# Patient Record
Sex: Female | Born: 1950 | Race: White | Hispanic: No | Smoking: Never smoker
Health system: Southern US, Community
[De-identification: ages and names within clinical notes are randomized; demographics above are authoritative.]

## PROBLEM LIST (undated history)

## (undated) ENCOUNTER — Emergency Department (HOSPITAL_COMMUNITY): Discharge status: Absent without leave | Payer: BC Managed Care – PPO

## (undated) DIAGNOSIS — M1990 Unspecified osteoarthritis, unspecified site: Secondary | ICD-10-CM

## (undated) HISTORY — PX: APPENDECTOMY: SHX54

## (undated) HISTORY — PX: HERNIA REPAIR: SHX51

---

## 1999-02-18 ENCOUNTER — Other Ambulatory Visit: Admission: RE | Admit: 1999-02-18 | Discharge: 1999-02-18 | Payer: Self-pay | Admitting: Obstetrics & Gynecology

## 2000-05-29 ENCOUNTER — Other Ambulatory Visit: Admission: RE | Admit: 2000-05-29 | Discharge: 2000-05-29 | Payer: Self-pay | Admitting: Obstetrics & Gynecology

## 2000-05-29 ENCOUNTER — Encounter (INDEPENDENT_AMBULATORY_CARE_PROVIDER_SITE_OTHER): Payer: Self-pay

## 2000-05-29 ENCOUNTER — Other Ambulatory Visit: Admission: RE | Admit: 2000-05-29 | Discharge: 2000-05-29 | Payer: Self-pay | Admitting: Family Medicine

## 2000-10-30 ENCOUNTER — Other Ambulatory Visit: Admission: RE | Admit: 2000-10-30 | Discharge: 2000-10-30 | Payer: Self-pay | Admitting: Obstetrics & Gynecology

## 2001-11-23 ENCOUNTER — Other Ambulatory Visit: Admission: RE | Admit: 2001-11-23 | Discharge: 2001-11-23 | Payer: Self-pay | Admitting: Obstetrics & Gynecology

## 2002-06-05 ENCOUNTER — Ambulatory Visit (HOSPITAL_COMMUNITY): Admission: RE | Admit: 2002-06-05 | Discharge: 2002-06-05 | Payer: Self-pay | Admitting: Gastroenterology

## 2002-06-05 ENCOUNTER — Encounter: Payer: Self-pay | Admitting: Gastroenterology

## 2002-06-05 ENCOUNTER — Encounter (INDEPENDENT_AMBULATORY_CARE_PROVIDER_SITE_OTHER): Payer: Self-pay | Admitting: Specialist

## 2002-06-14 ENCOUNTER — Other Ambulatory Visit: Admission: RE | Admit: 2002-06-14 | Discharge: 2002-06-14 | Payer: Self-pay | Admitting: Obstetrics & Gynecology

## 2003-03-03 ENCOUNTER — Other Ambulatory Visit: Admission: RE | Admit: 2003-03-03 | Discharge: 2003-03-03 | Payer: Self-pay | Admitting: Obstetrics & Gynecology

## 2003-07-10 ENCOUNTER — Inpatient Hospital Stay (HOSPITAL_COMMUNITY): Admission: AD | Admit: 2003-07-10 | Discharge: 2003-07-15 | Payer: Self-pay | Admitting: Psychiatry

## 2003-07-10 ENCOUNTER — Emergency Department (HOSPITAL_COMMUNITY): Admission: EM | Admit: 2003-07-10 | Discharge: 2003-07-10 | Payer: Self-pay | Admitting: Emergency Medicine

## 2003-09-05 ENCOUNTER — Other Ambulatory Visit: Admission: RE | Admit: 2003-09-05 | Discharge: 2003-09-05 | Payer: Self-pay | Admitting: Obstetrics & Gynecology

## 2004-06-14 ENCOUNTER — Encounter: Admission: RE | Admit: 2004-06-14 | Discharge: 2004-06-14 | Payer: Self-pay | Admitting: Family Medicine

## 2004-08-29 ENCOUNTER — Emergency Department (HOSPITAL_COMMUNITY): Admission: EM | Admit: 2004-08-29 | Discharge: 2004-08-29 | Payer: Self-pay | Admitting: Family Medicine

## 2005-03-30 ENCOUNTER — Other Ambulatory Visit: Admission: RE | Admit: 2005-03-30 | Discharge: 2005-03-30 | Payer: Self-pay | Admitting: Family Medicine

## 2005-10-29 ENCOUNTER — Emergency Department (HOSPITAL_COMMUNITY): Admission: EM | Admit: 2005-10-29 | Discharge: 2005-10-29 | Payer: Self-pay | Admitting: Emergency Medicine

## 2006-01-05 ENCOUNTER — Emergency Department (HOSPITAL_COMMUNITY): Admission: EM | Admit: 2006-01-05 | Discharge: 2006-01-05 | Payer: Self-pay | Admitting: Family Medicine

## 2006-07-06 ENCOUNTER — Other Ambulatory Visit: Admission: RE | Admit: 2006-07-06 | Discharge: 2006-07-06 | Payer: Self-pay | Admitting: Family Medicine

## 2006-07-28 ENCOUNTER — Ambulatory Visit (HOSPITAL_COMMUNITY): Admission: RE | Admit: 2006-07-28 | Discharge: 2006-07-28 | Payer: Self-pay | Admitting: Family Medicine

## 2006-08-31 ENCOUNTER — Emergency Department (HOSPITAL_COMMUNITY): Admission: EM | Admit: 2006-08-31 | Discharge: 2006-08-31 | Payer: Self-pay | Admitting: Emergency Medicine

## 2006-09-05 ENCOUNTER — Encounter: Admission: RE | Admit: 2006-09-05 | Discharge: 2006-09-05 | Payer: Self-pay | Admitting: Family Medicine

## 2006-11-15 ENCOUNTER — Emergency Department (HOSPITAL_COMMUNITY): Admission: EM | Admit: 2006-11-15 | Discharge: 2006-11-15 | Payer: Self-pay | Admitting: Family Medicine

## 2007-07-30 ENCOUNTER — Encounter: Admission: RE | Admit: 2007-07-30 | Discharge: 2007-07-30 | Payer: Self-pay | Admitting: Family Medicine

## 2008-04-03 ENCOUNTER — Encounter: Admission: RE | Admit: 2008-04-03 | Discharge: 2008-04-03 | Payer: Self-pay | Admitting: Infectious Diseases

## 2008-04-25 IMAGING — CR DG CHEST 2V
2 series · 2 of 2 positions shown · non-contrast
Comparison: 08/31/2006.

CHEST - 2 VIEW
CLINICAL DATA: Back and chest pain.

[view not recorded (1 of 2)]
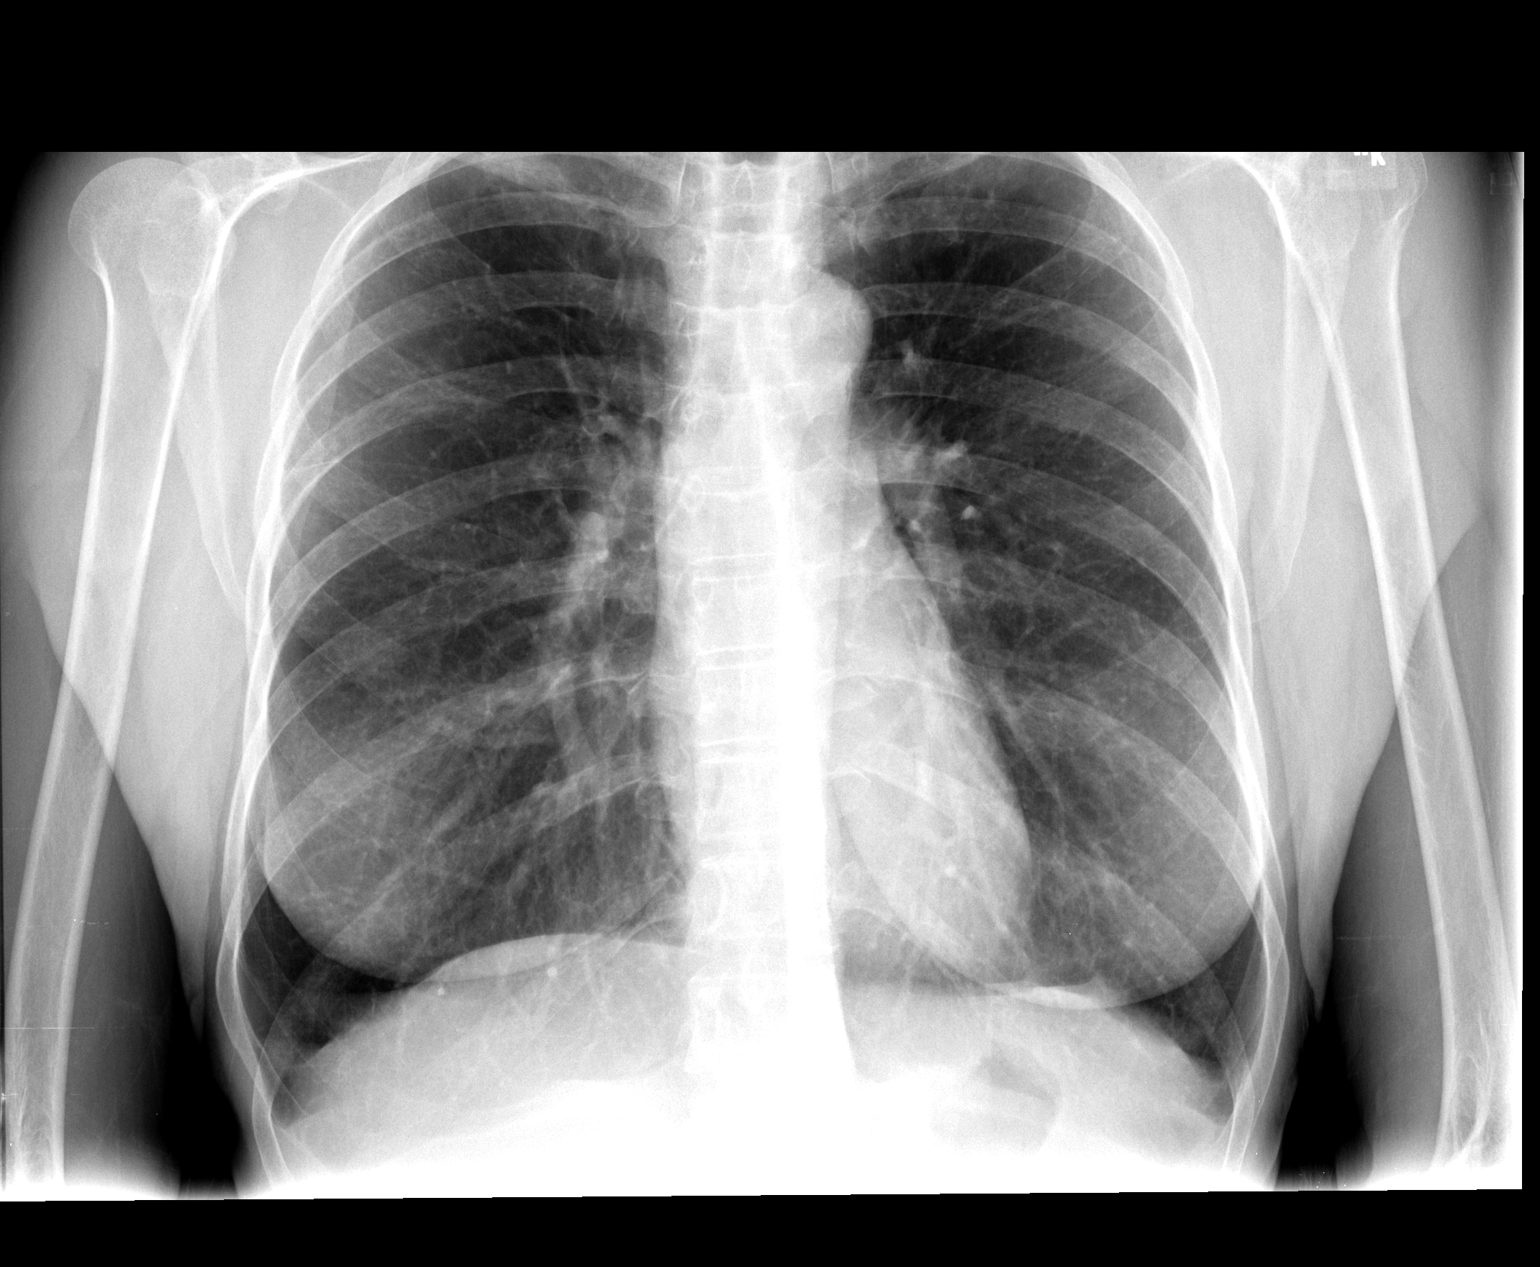

[view not recorded (2 of 2)]
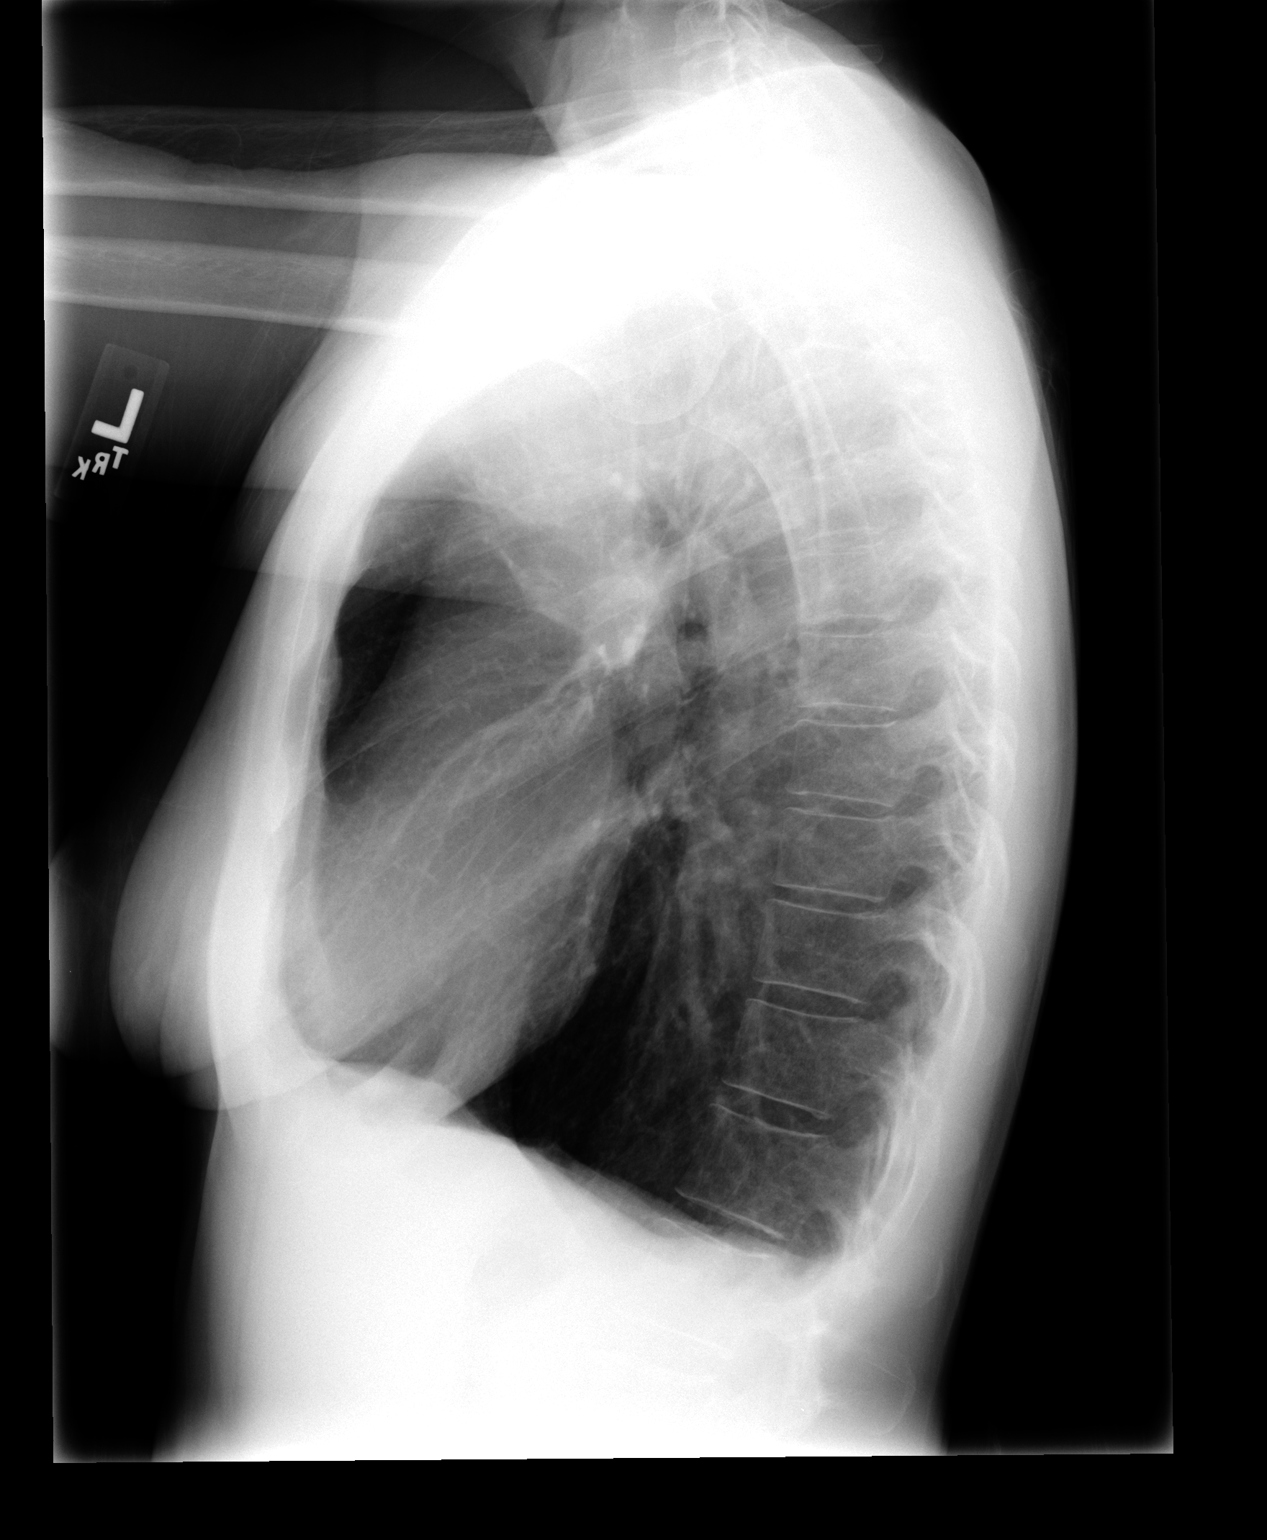

[2 of 2 positions shown; findings below may reference images not displayed]

FINDINGS: Lungs clear. Cardiomediastinal contours normal. No edema, effusions,
or airspace disease. Hyperinflation consistent with COPD/emphysema.
**********************************************************************
IMPRESSION
Emphysematous change without acute cardiopulmonary disease. 
**********************************************************************

## 2008-08-16 ENCOUNTER — Emergency Department (HOSPITAL_COMMUNITY): Admission: EM | Admit: 2008-08-16 | Discharge: 2008-08-16 | Payer: Self-pay | Admitting: Family Medicine

## 2008-09-07 ENCOUNTER — Emergency Department (HOSPITAL_COMMUNITY): Admission: EM | Admit: 2008-09-07 | Discharge: 2008-09-07 | Payer: Self-pay | Admitting: Emergency Medicine

## 2009-01-11 ENCOUNTER — Emergency Department (HOSPITAL_COMMUNITY): Admission: EM | Admit: 2009-01-11 | Discharge: 2009-01-11 | Payer: Self-pay | Admitting: Emergency Medicine

## 2010-04-25 ENCOUNTER — Encounter: Payer: Self-pay | Admitting: Family Medicine

## 2010-07-08 LAB — CBC
HCT: 43.2 % (ref 36.0–46.0)
Hemoglobin: 14.8 g/dL (ref 12.0–15.0)
MCHC: 34.2 g/dL (ref 30.0–36.0)
MCV: 93.9 fL (ref 78.0–100.0)
Platelets: 252 10*3/uL (ref 150–400)
RBC: 4.6 MIL/uL (ref 3.87–5.11)
RDW: 12 % (ref 11.5–15.5)
WBC: 10.4 10*3/uL (ref 4.0–10.5)

## 2010-07-08 LAB — DIFFERENTIAL
Basophils Absolute: 0 10*3/uL (ref 0.0–0.1)
Basophils Relative: 0 % (ref 0–1)
Eosinophils Absolute: 0 10*3/uL (ref 0.0–0.7)
Eosinophils Relative: 0 % (ref 0–5)
Lymphocytes Relative: 21 % (ref 12–46)
Lymphs Abs: 2.1 10*3/uL (ref 0.7–4.0)
Monocytes Absolute: 0.7 10*3/uL (ref 0.1–1.0)
Monocytes Relative: 7 % (ref 3–12)
Neutro Abs: 7.5 10*3/uL (ref 1.7–7.7)
Neutrophils Relative %: 72 % (ref 43–77)

## 2010-07-08 LAB — SEDIMENTATION RATE: Sed Rate: 5 mm/hr (ref 0–22)

## 2010-07-08 LAB — RPR: RPR Ser Ql: NONREACTIVE

## 2010-08-20 NOTE — Discharge Summary (Signed)
NAME:  Alyssa Vaughn, Alyssa Vaughn                       ACCOUNT NO.:  [id number]   MEDICAL RECORD NO.:  [id number]                   PATIENT TYPE:  IPS   LOCATION:  0502                                 FACILITY:  [facility]   PHYSICIAN:  Geoffery Lyons, M.D.                   DATE OF BIRTH:  14-Feb-1951   DATE OF ADMISSION:  07/10/2003  DATE OF DISCHARGE:  07/15/2003                                 DISCHARGE SUMMARY   CHIEF COMPLAINT AND PRESENTING ILLNESS:  This was the first admission to  [facility]  for this 60 year old white female,  intoxicated, threatening suicide.  Had resumed alcohol after starting to  feel depressed.  Went to a hotel with a man, called her husband numerous  times.  He had her committed.  Worried that she was using crack,  methamphetamine, etc. But she only relapsed on alcohol.   PAST PSYCHIATRIC HISTORY:  Dr. Jennelle Human prescribed Prozac, Dextrostat 10 mg  twice a day for ADHD and Vistaril for sleep.   ALCOHOL AND DRUG HISTORY:  [facility] 2003, recent relapse on alcohol.   PAST MEDICAL HISTORY:  Noncontributory.   MEDICATIONS:  Prozac 15 mg per day, Dextrostat 10 mg twice a day, Vistaril.   PHYSICAL EXAMINATION:  Performed, failed to show any acute findings.   LABORATORY WORKUP:  CBC was within normal limits.  Thyroid profile within  normal limits.  Blood chemistries were within normal limits.  EKG normal.   MENTAL STATUS EXAM:  Cognition well preserved.   ADMISSION DIAGNOSES:   AXIS I:   AXIS II:  No diagnosis.   AXIS III:   AXIS IV:  Moderate.   AXIS V:  Global assessment of function upon admission @@, highest global  assessment of function in past year @@   COURSE IN HOSPITAL:  She was admitted and started on intensive individual  and group psychotherapy.  She was given Ambien for sleep as well as  Seroquel.  She was detoxified with Librium.  She was started on Lexapro 5 mg  per day and Lexapro was increased to 10.  She was having a very  hard time  detoxing, endorsing feeling very overwhelmed, distraught, relapsed after a  couple of years of abstinence, got involved trying to help a homeless man  who was a substance abuser.  Series of events led her to go into full blown,  active addiction.  Had not been back to work in a week.  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  Very overwhelmed and ashamed, guilty.  Initially felt that the husband was supportive but later communication  stated that the husband would not have her back.  Not knowing what to do in  terms of the school, very anxious, depressed, tearful, almost unable to stop  crying, dealing with shame and guilt, hopelessness and helplessness.  She  did endorse that this was a relationship that she  had with this homeless guy  and became almost obsessed in trying to help him.  We continued to detox.  She had that the husband was not willing to take her back.  She started  looking into the possibility of a halfway house.  Continued to be very  labile, very tearful.  As she was able to talk in group and deal with  individually, she was more insightful.  There was a place available in the  [facility] and she was willing to go.  So on April 12, she was in full  contact with reality.  There were no suicidal ideas, no homicidal ideas.  She was going to the halfway house, insightful, willing to work on long term  abstinence and all her addictive patterns, more aware of what she needed to  do, willing and motivated to pursue it.   DISCHARGE DIAGNOSES:   AXIS I:  1. Alcohol dependence.  2. Depressive disorder not otherwise specified..  3. Attention deficit hyperactivity disorder.   AXIS II:  No diagnosis.   AXIS III:  No diagnosis.   AXIS IV:  Moderate.   AXIS V:  Global assessment of function upon discharge 55.   DISCHARGE MEDICATIONS:  1. Seroquel 100 1/2 twice a day and 1 at night.  2. Lexapro 10 mg daily.   DISPOSITION:  Follow up with Dr. Jennelle Human.                                                Geoffery Lyons, M.D.    IL/MEDQ  D:  08/10/2003  T:  08/11/2003  Job:  [id number]

## 2011-01-17 LAB — BASIC METABOLIC PANEL
Calcium: 8.6
GFR calc non Af Amer: >60
Glucose, Bld: 81
Potassium: 3.3 — ABNORMAL LOW
Sodium: 142

## 2011-01-17 LAB — RAPID URINE DRUG SCREEN, HOSP PERFORMED
Benzodiazepines: NOT DETECTED
Cocaine: NOT DETECTED
Opiates: NOT DETECTED
Tetrahydrocannabinol: NOT DETECTED

## 2011-01-17 LAB — POCT CARDIAC MARKERS
CKMB, poc: <1 — ABNORMAL LOW
Myoglobin, poc: 34.5
Operator id: 285491
Troponin i, poc: <0.05

## 2011-01-17 LAB — POCT URINALYSIS DIP (DEVICE)
Bilirubin Urine: NEGATIVE
Glucose, UA: NEGATIVE
Hgb urine dipstick: NEGATIVE
Ketones, ur: NEGATIVE
Operator id: 247071
Specific Gravity, Urine: 1.01
Urobilinogen, UA: 0.2

## 2011-01-17 LAB — DIFFERENTIAL
Basophils Absolute: 0
Basophils Relative: 0
Lymphocytes Relative: 15
Monocytes Relative: 6
Neutro Abs: 8.9 — ABNORMAL HIGH
Neutrophils Relative %: 77

## 2011-01-17 LAB — CBC
Hemoglobin: 14.4
MCHC: 34.6
RBC: 4.59

## 2014-07-15 ENCOUNTER — Other Ambulatory Visit (HOSPITAL_COMMUNITY): Payer: Self-pay | Admitting: Gastroenterology

## 2014-07-15 DIAGNOSIS — B1920 Unspecified viral hepatitis C without hepatic coma: Secondary | ICD-10-CM

## 2014-08-06 ENCOUNTER — Ambulatory Visit (HOSPITAL_COMMUNITY): Payer: Self-pay

## 2014-08-14 ENCOUNTER — Ambulatory Visit (HOSPITAL_COMMUNITY): Payer: Self-pay

## 2014-09-16 ENCOUNTER — Ambulatory Visit (HOSPITAL_COMMUNITY): Payer: Self-pay

## 2014-09-23 ENCOUNTER — Ambulatory Visit (HOSPITAL_COMMUNITY)
Admission: RE | Admit: 2014-09-23 | Discharge: 2014-09-23 | Disposition: A | Discharge status: Home / Self Care | Payer: BC Managed Care – PPO | Source: Ambulatory Visit | Attending: Gastroenterology | Admitting: Gastroenterology

## 2014-09-23 DIAGNOSIS — K8020 Calculus of gallbladder without cholecystitis without obstruction: Secondary | ICD-10-CM | POA: Insufficient documentation

## 2014-09-23 DIAGNOSIS — B182 Chronic viral hepatitis C: Secondary | ICD-10-CM | POA: Diagnosis present

## 2014-09-23 DIAGNOSIS — B1920 Unspecified viral hepatitis C without hepatic coma: Secondary | ICD-10-CM

## 2014-12-22 ENCOUNTER — Other Ambulatory Visit: Payer: Self-pay | Admitting: Obstetrics & Gynecology

## 2014-12-24 LAB — CYTOLOGY - PAP

## 2015-10-22 ENCOUNTER — Other Ambulatory Visit (HOSPITAL_COMMUNITY): Payer: Self-pay | Admitting: Neurosurgery

## 2015-10-22 DIAGNOSIS — R1310 Dysphagia, unspecified: Secondary | ICD-10-CM

## 2015-10-27 ENCOUNTER — Ambulatory Visit (HOSPITAL_COMMUNITY)
Admission: RE | Admit: 2015-10-27 | Discharge: 2015-10-27 | Disposition: A | Discharge status: Home / Self Care | Payer: BC Managed Care – PPO | Source: Ambulatory Visit | Attending: Neurosurgery | Admitting: Neurosurgery

## 2015-10-27 DIAGNOSIS — R1312 Dysphagia, oropharyngeal phase: Secondary | ICD-10-CM | POA: Insufficient documentation

## 2015-10-27 DIAGNOSIS — R1310 Dysphagia, unspecified: Secondary | ICD-10-CM

## 2015-10-27 NOTE — Progress Notes (Signed)
MBSS complete. Full report located under chart review in imaging section.   CH[facility] IP CLINICAL IMPRESSIONS 10/27/2015  Therapy Diagnosis Suspected primary esophageal dysphagia  Clinical Impression Pt exhibits a suspected primary esophageal dysphagia. MBS does not diagnose below the level of the upper esophageal sphincter, however esophagus was scanned revealing what appeared to be mid to distal esophageal stasis. Findings coincide with pt's symptoms of frequent throat clearing, pharyngeal globus sensation, hoarse vocal quality and recent eructation. Slight residue at level of cervical esophagus. No larygneal penetration or aspiration; no pharyngeal residue. SLP educated pt on esophageal precautions. Recommend continue regular texture, thin, liquids, esophageal precautions and discussion with MD re: possible further esophageal work up, medications if warranted.   Impact on safety and function Mild aspiration risk    Houston Siren M.Ed [employer] [phone]

## 2016-01-11 ENCOUNTER — Other Ambulatory Visit: Payer: Self-pay | Admitting: Neurosurgery

## 2016-01-11 DIAGNOSIS — M542 Cervicalgia: Secondary | ICD-10-CM

## 2016-01-21 ENCOUNTER — Other Ambulatory Visit: Payer: BC Managed Care – PPO

## 2016-03-15 ENCOUNTER — Other Ambulatory Visit: Payer: Self-pay | Admitting: Neurosurgery

## 2016-03-15 DIAGNOSIS — M542 Cervicalgia: Secondary | ICD-10-CM

## 2016-03-21 ENCOUNTER — Ambulatory Visit
Admission: RE | Admit: 2016-03-21 | Discharge: 2016-03-21 | Disposition: A | Discharge status: Home / Self Care | Payer: BC Managed Care – PPO | Source: Ambulatory Visit | Attending: Neurosurgery | Admitting: Neurosurgery

## 2016-03-21 DIAGNOSIS — M542 Cervicalgia: Secondary | ICD-10-CM

## 2016-04-05 ENCOUNTER — Ambulatory Visit
Admission: RE | Admit: 2016-04-05 | Discharge: 2016-04-05 | Disposition: A | Discharge status: Home / Self Care | Payer: BC Managed Care – PPO | Source: Ambulatory Visit | Attending: Chiropractic Medicine | Admitting: Chiropractic Medicine

## 2016-04-05 ENCOUNTER — Other Ambulatory Visit: Payer: Self-pay | Admitting: Chiropractic Medicine

## 2016-04-05 DIAGNOSIS — M25551 Pain in right hip: Secondary | ICD-10-CM

## 2016-06-09 ENCOUNTER — Other Ambulatory Visit: Payer: Self-pay | Admitting: Otolaryngology

## 2016-06-09 ENCOUNTER — Ambulatory Visit
Admission: RE | Admit: 2016-06-09 | Discharge: 2016-06-09 | Disposition: A | Discharge status: Home / Self Care | Payer: BC Managed Care – PPO | Source: Ambulatory Visit | Attending: Otolaryngology | Admitting: Otolaryngology

## 2016-06-09 DIAGNOSIS — J320 Chronic maxillary sinusitis: Secondary | ICD-10-CM

## 2016-07-06 ENCOUNTER — Other Ambulatory Visit (HOSPITAL_COMMUNITY): Payer: Self-pay | Admitting: Otolaryngology

## 2016-07-06 DIAGNOSIS — J320 Chronic maxillary sinusitis: Secondary | ICD-10-CM

## 2016-07-19 ENCOUNTER — Ambulatory Visit (HOSPITAL_COMMUNITY): Payer: BC Managed Care – PPO

## 2017-09-05 ENCOUNTER — Other Ambulatory Visit: Payer: Self-pay

## 2017-09-05 ENCOUNTER — Emergency Department (HOSPITAL_COMMUNITY)
Admission: EM | Admit: 2017-09-05 | Discharge: 2017-09-05 | Discharge status: Against Medical Advice | Payer: BC Managed Care – PPO | Attending: Emergency Medicine | Admitting: Emergency Medicine

## 2017-09-05 ENCOUNTER — Encounter (HOSPITAL_COMMUNITY): Payer: Self-pay | Admitting: Emergency Medicine

## 2017-09-05 ENCOUNTER — Emergency Department (HOSPITAL_COMMUNITY): Payer: BC Managed Care – PPO

## 2017-09-05 DIAGNOSIS — Z79899 Other long term (current) drug therapy: Secondary | ICD-10-CM | POA: Insufficient documentation

## 2017-09-05 DIAGNOSIS — R079 Chest pain, unspecified: Secondary | ICD-10-CM

## 2017-09-05 DIAGNOSIS — R001 Bradycardia, unspecified: Secondary | ICD-10-CM | POA: Diagnosis not present

## 2017-09-05 HISTORY — DX: Unspecified osteoarthritis, unspecified site: M19.90

## 2017-09-05 LAB — BASIC METABOLIC PANEL
Anion gap: 8 (ref 5–15)
BUN: 14 mg/dL (ref 6–20)
CO2: 29 mmol/L (ref 22–32)
CREATININE: 0.69 mg/dL (ref 0.44–1.00)
Calcium: 9.9 mg/dL (ref 8.9–10.3)
Chloride: 106 mmol/L (ref 101–111)
GFR calc Af Amer: >60 mL/min (ref >60)
GFR calc non Af Amer: >60 mL/min (ref >60)
GLUCOSE: 99 mg/dL (ref 65–99)
Potassium: 3.7 mmol/L (ref 3.5–5.1)
Sodium: 143 mmol/L (ref 135–145)

## 2017-09-05 LAB — CBC
HCT: 42.6 % (ref 36.0–46.0)
Hemoglobin: 14.5 g/dL (ref 12.0–15.0)
MCH: 30.8 pg (ref 26.0–34.0)
MCHC: 34 g/dL (ref 30.0–36.0)
MCV: 90.4 fL (ref 78.0–100.0)
PLATELETS: 206 10*3/uL (ref 150–400)
RBC: 4.71 MIL/uL (ref 3.87–5.11)
RDW: 11.6 % (ref 11.5–15.5)
WBC: 7.1 10*3/uL (ref 4.0–10.5)

## 2017-09-05 LAB — I-STAT TROPONIN, ED
Troponin i, poc: 0 ng/mL (ref 0.00–0.08)
Troponin i, poc: 0 ng/mL (ref 0.00–0.08)

## 2017-09-05 LAB — TSH: TSH: 5.276 u[IU]/mL — ABNORMAL HIGH (ref 0.350–4.500)

## 2017-09-05 NOTE — ED Notes (Signed)
Visitor came looking for pt.  Called pt and pt is at home.

## 2017-09-05 NOTE — ED Provider Notes (Signed)
[facility] EMERGENCY DEPARTMENT Provider Note   CSN: [id number] Arrival date & time: 09/05/17  1519     History   Chief Complaint Chief Complaint  Patient presents with  . Chest Pain    HPI Alyssa Vaughn is a 67 y.o. female.  HPI Patient with left-sided chest pain.  Dull and in her mid chest.  Has had over the last week.  States nothing makes it better or worse but it is worse with breathing.  No swelling in her legs.  No recent travel.  Does not smoke although she was a former smoker.  Does not goes worse with exertion.  States she went to an urgent care was told to come in here because her heart rate was slow also.  No cardiac history.  No near syncopal episodes but states she does feel little lightheaded at times.  She is not on medicines to slow down her heart rate. Past Medical History:  Diagnosis Date  . Arthritis     There are no active problems to display for this patient.   Past Surgical History:  Procedure Laterality Date  . APPENDECTOMY    . HERNIA REPAIR       OB History   None      Home Medications    Prior to Admission medications   Medication Sig Start Date End Date Taking? Authorizing Provider  b complex vitamins capsule Take 1 capsule by mouth daily.   Yes [provider]  clonazePAM (KLONOPIN) 0.5 MG tablet Take 0.25-0.5 mg by mouth at bedtime as needed for anxiety. 08/31/17  Yes [provider]  FLUoxetine (PROZAC) 10 MG tablet Take 5-10 mg by mouth daily. Take once daily for 7 days (5mg ) then, increase to 10mg  once daily 08/31/17  Yes [provider]  HYDROcodone-acetaminophen (NORCO/VICODIN) 5-325 MG tablet Take 1-2 tablets by mouth every 6 (six) hours as needed for pain. Daily max dose 8 tablets per day 08/29/17  Yes [provider]  vitamin C (ASCORBIC ACID) 500 MG tablet Take 500 mg by mouth daily.   Yes [provider]    Family History No family history on file.  Social  History Social History   Tobacco Use  . Smoking status: Never Smoker  . Smokeless tobacco: Never Used  Substance Use Topics  . Alcohol use: Never    Frequency: Never  . Drug use: Never     Allergies   Biaxin [clarithromycin] and Nsaids   Review of Systems Review of Systems  Constitutional: Negative for appetite change.  HENT: Negative for congestion.   Respiratory: Negative for shortness of breath.   Cardiovascular: Positive for chest pain. Negative for palpitations.  Gastrointestinal: Negative for abdominal pain.  Genitourinary: Negative for dyspareunia.  Musculoskeletal: Negative for back pain.  Skin: Negative for rash.  Neurological: Positive for light-headedness.  Hematological: Negative for adenopathy.  Psychiatric/Behavioral: Negative for confusion.     Physical Exam Updated Vital Signs BP (!) 159/62   Pulse (!) 54   Temp 97.6 F (36.4 C) (Oral)   Resp 15   Ht 5\' 2"  (1.575 m)   Wt 50.3 kg (111 lb)   SpO2 100%   BMI 20.30 kg/m   Physical Exam  Constitutional: She appears well-developed.  HENT:  Head: Atraumatic.  Neck: Normal range of motion.  Cardiovascular:  Mild bradycardia  Pulmonary/Chest: Effort normal and breath sounds normal.  Musculoskeletal:       Right lower leg: She exhibits no tenderness and  no edema.       Left lower leg: She exhibits no tenderness and no edema.  Neurological: She is alert.  Skin: Skin is warm. Capillary refill takes less than 2 seconds.  Psychiatric: She has a normal mood and affect.     ED Treatments / Results  Labs (all labs ordered are listed, but only abnormal results are displayed) Labs Reviewed  BASIC METABOLIC PANEL  CBC  TSH  I-STAT TROPONIN, ED  I-STAT TROPONIN, ED    EKG EKG Interpretation  Date/Time:  Tuesday September 05 2017 15:25:18 EDT Ventricular Rate:  51 PR Interval:  164 QRS Duration: 84 QT Interval:  470 QTC Calculation: 433 R Axis:   60 Text Interpretation:  Sinus bradycardia  Otherwise normal ECG Confirmed by Davonna Belling [phone]) on 09/05/2017 8:42:54 PM   Radiology No results found.  Procedures Procedures (including critical care time)  Medications Ordered in ED Medications - No data to display   Initial Impression / Assessment and Plan / ED Course  I have reviewed the triage vital signs and the nursing notes.  Pertinent labs & imaging results that were available during my care of the patient were reviewed by me and considered in my medical decision making (see chart for details).     Patient with chest pain.  Doubt cardiac cause to the pain.  Also EKG reassuring enzymes negative for ischemia.  Does however have a mild bradycardia.  No known history of this.  Minimally symptomatic if at all.  Will have follow-up with cardiology for further evaluation.  Discharge home.  TSH is pending and can be followed as an outpatient.  Final Clinical Impressions(s) / ED Diagnoses   Final diagnoses:  Nonspecific chest pain  Bradycardia    ED Discharge Orders    None       Davonna Belling, MD 09/05/17 2232

## 2017-09-05 NOTE — ED Notes (Signed)
Pt's family member notified RN pt change in condition. RN assessed pt. Pt denies chest pain, or change, states she just felt different for a minute but it went away. HR-59

## 2017-09-05 NOTE — ED Triage Notes (Signed)
Pt states she has been having CP for over a week. Worsens when she takes a deep breath. Pt went to walk in clinic today and was sent here due to low HR. Pt states she has been weak/light headed.

## 2017-09-05 NOTE — ED Notes (Signed)
Pt & family member questioning lab results, notified no critical results at this time.

## 2017-09-05 NOTE — ED Notes (Signed)
pts husband and pt came to [facility] stating they just a text saying pts room is ready.  They were in parking lot in car.  Pt put back in system.  Asked pt to stay in lobby and when room becomes available she will be called at that time.

## 2017-09-14 IMAGING — CR DG HIP (WITH OR WITHOUT PELVIS) 2-3V*R*
2 series · 2 of 2 positions shown · non-contrast
Comparison: AP view of the pelvis of April 03, 2016

CLINICAL DATA: Two weeks of right hip pain which began after
lifting a heavy optic. Pain extends from the iliac crest to the
groin.

EXAM:
DG HIP (WITH OR WITHOUT PELVIS) 2-3V RIGHT

[w hip ap right]
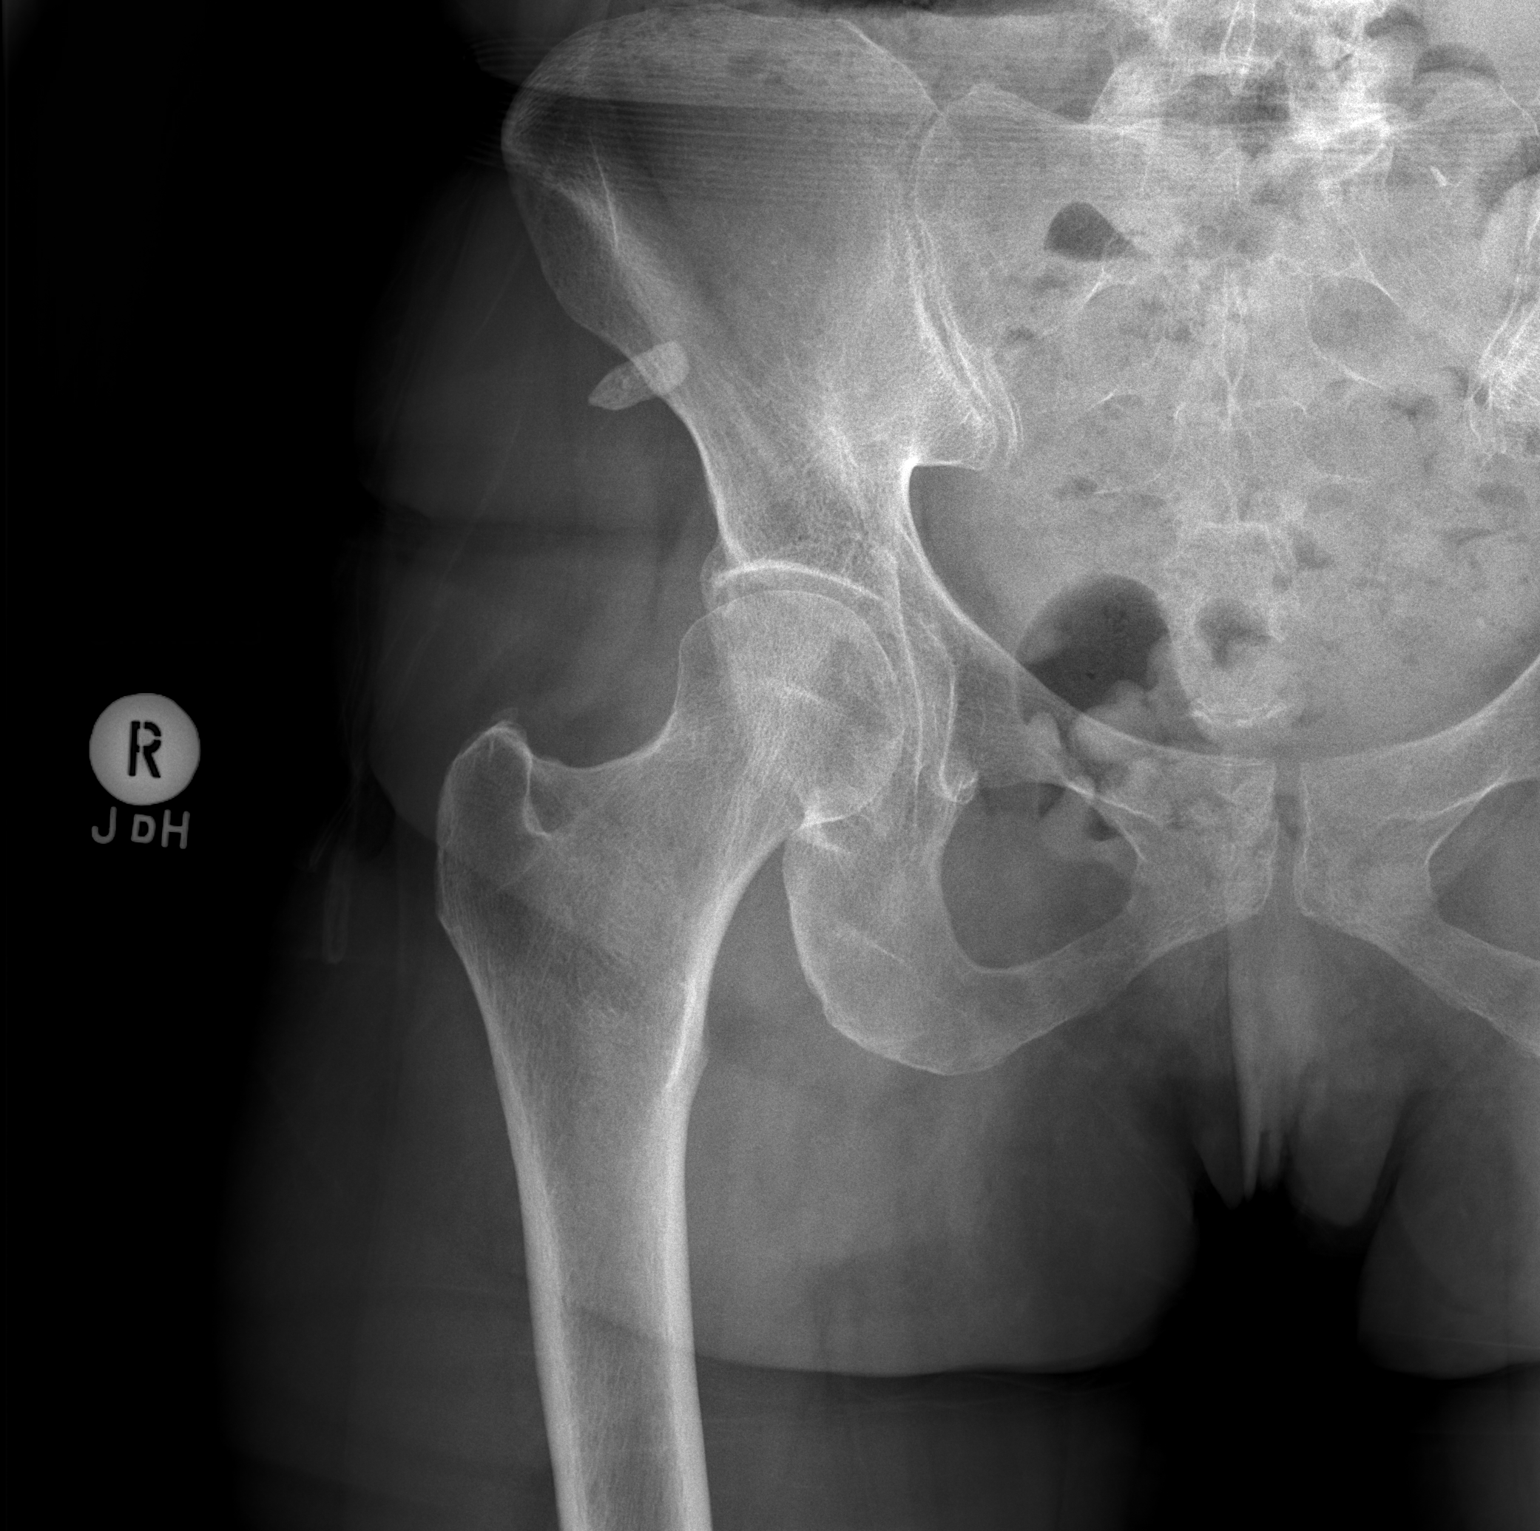

[w hip frog right]
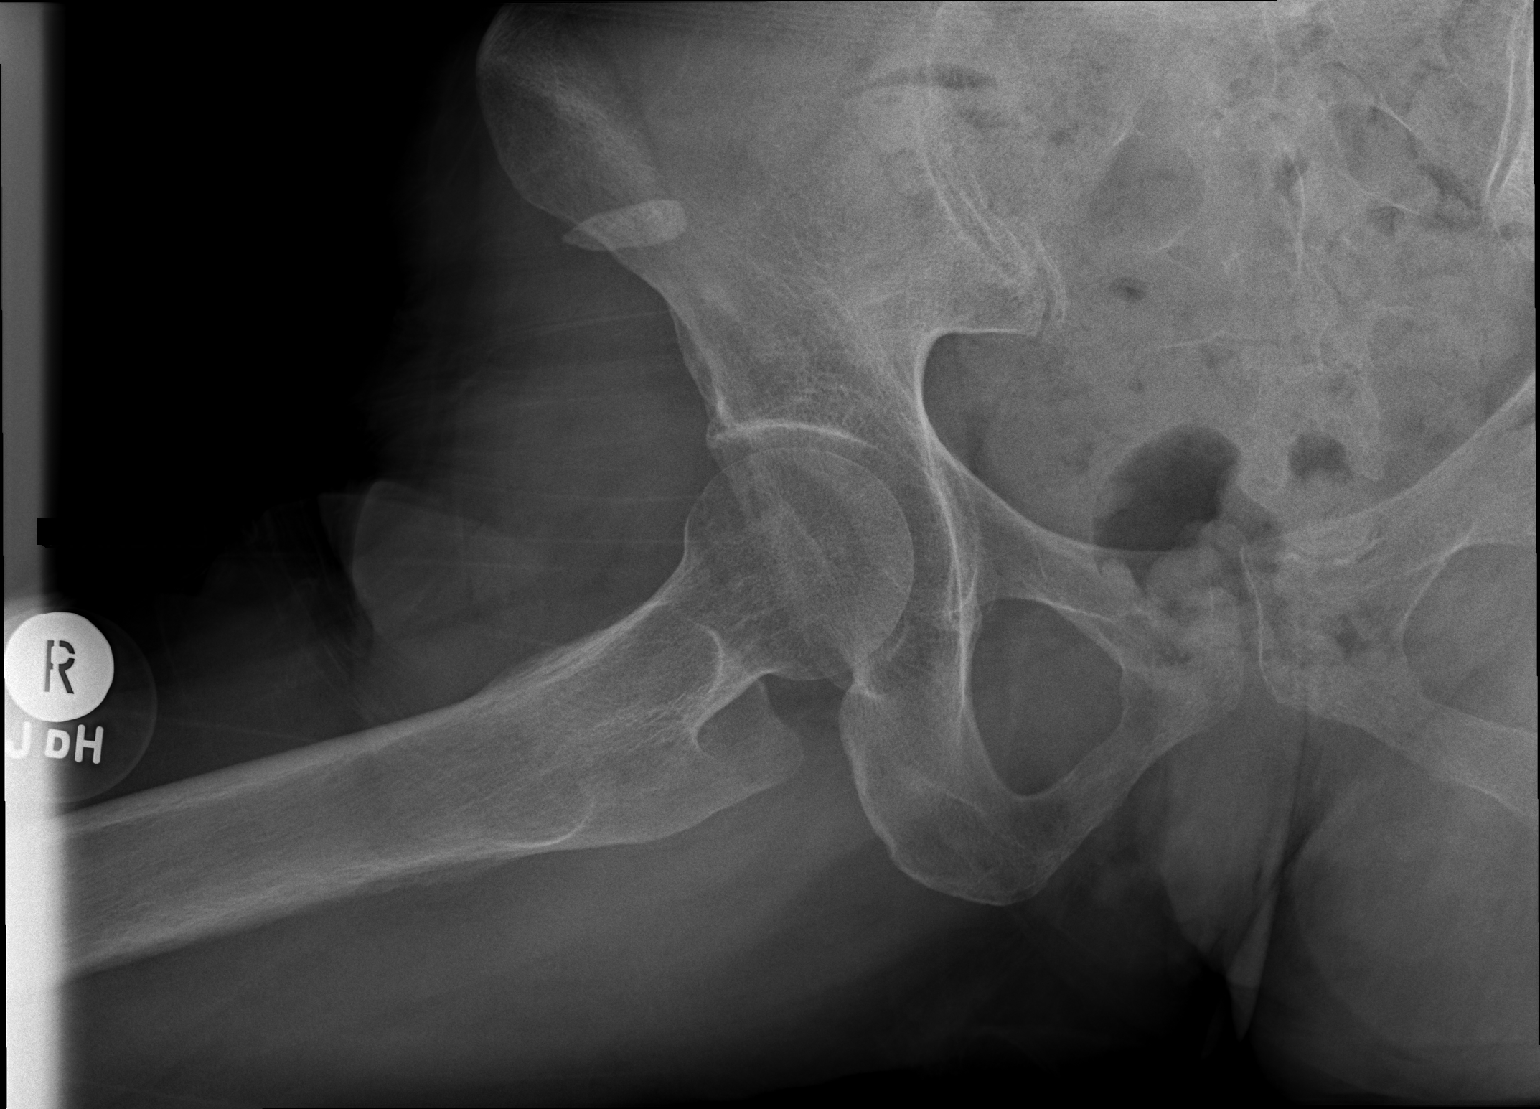

[2 of 2 positions shown; findings below may reference images not displayed]

FINDINGS: The bones are subjectively adequately mineralized. The observed
portions of the right hemipelvis are intact. There is an ovoid
calcification in the soft tissues likely in the right gluteal region
which is stable. AP and lateral views of the right hip reveal
preservation of the joint space. The articular surfaces of the
femoral head and acetabulum remains smoothly rounded. The femoral
neck, intertrochanteric, and subtrochanteric regions are normal.
IMPRESSION: There is no acute or significant chronic bony abnormality of the
right hip joint. There is persistent soft tissue calcification
likely lying in the right gluteal region but its true position in
the AP axis is un.

## 2018-02-19 ENCOUNTER — Other Ambulatory Visit: Payer: Self-pay

## 2018-02-19 MED ORDER — ALPRAZOLAM 0.5 MG PO TABS: 0.5000 mg | ORAL_TABLET | Freq: Every evening | ORAL | 0 refills | Status: DC | PRN

## 2018-02-26 ENCOUNTER — Encounter: Payer: Self-pay | Admitting: Emergency Medicine

## 2018-02-26 DIAGNOSIS — F988 Other specified behavioral and emotional disorders with onset usually occurring in childhood and adolescence: Secondary | ICD-10-CM | POA: Insufficient documentation

## 2018-02-26 DIAGNOSIS — F411 Generalized anxiety disorder: Secondary | ICD-10-CM | POA: Insufficient documentation

## 2018-03-12 ENCOUNTER — Ambulatory Visit: Payer: Self-pay | Admitting: Psychiatry

## 2018-03-12 ENCOUNTER — Ambulatory Visit: Payer: BC Managed Care – PPO | Admitting: Psychiatry

## 2018-03-12 ENCOUNTER — Encounter: Payer: Self-pay | Admitting: Psychiatry

## 2018-03-12 DIAGNOSIS — F411 Generalized anxiety disorder: Secondary | ICD-10-CM | POA: Diagnosis not present

## 2018-03-12 DIAGNOSIS — F331 Major depressive disorder, recurrent, moderate: Secondary | ICD-10-CM | POA: Diagnosis not present

## 2018-03-12 DIAGNOSIS — F900 Attention-deficit hyperactivity disorder, predominantly inattentive type: Secondary | ICD-10-CM | POA: Diagnosis not present

## 2018-03-12 MED ORDER — FLUOXETINE HCL 20 MG PO CAPS: 20.0000 mg | ORAL_CAPSULE | Freq: Every day | ORAL | 1 refills | Status: DC

## 2018-03-12 NOTE — Progress Notes (Signed)
CARI BURGO [id number] 1950-04-11 67 y.o.  Subjective:   Patient ID:  Alyssa Vaughn is a 67 y.o. (DOB [birth year]) female.  Chief Complaint:  Chief Complaint  Patient presents with  . Follow-up    Medication management   . Anxiety    Change makes her anxious    HPI Alyssa Vaughn presents to the office today for follow-up of worsening depression without reason about 2 weeks.  Not recognized previous seasonal depression.  Change always gets to her.  Retiring next week is a big change.    Change generic of fluoxetine last refill.  Pt reports that mood is Anxious and Depressed and describes anxiety as Minimal. Anxiety symptoms include: Excessive Worry,. Pt reports no sleep issues. Pt reports that appetite is reduced. Pt reports that energy is lethargic and down slightly. Concentration is down slightly. Suicidal thoughts:  denied by patient. More depressed and lack of confidence and concentration bc anxious around people.    Review of Systems:  Review of Systems  Neurological: Negative for tremors and weakness.  Psychiatric/Behavioral: Positive for dysphoric mood. Negative for agitation, behavioral problems, confusion, decreased concentration, hallucinations, self-injury, sleep disturbance and suicidal ideas. The patient is nervous/anxious. The patient is not hyperactive.     Medications: I have reviewed the patient's current medications.  Current Outpatient Medications  Medication Sig Dispense Refill  . ALPRAZolam (XANAX) 0.5 MG tablet Take 1 tablet (0.5 mg total) by mouth at bedtime as needed for anxiety. 30 tablet 0  . b complex vitamins capsule Take 1 capsule by mouth daily.    . clonazePAM (KLONOPIN) 0.5 MG tablet Take 0.25-0.5 mg by mouth at bedtime as needed for anxiety.  1  . FLUoxetine (PROZAC) 10 MG tablet Take 15 mg by mouth daily.   0  . vitamin C (ASCORBIC ACID) 500 MG tablet Take 500 mg by mouth daily.     No current facility-administered medications for  this visit.     Medication Side Effects: None  Allergies:  Allergies  Allergen Reactions  . Biaxin [Clarithromycin]     Upset stomach  . Nsaids Rash    Past Medical History:  Diagnosis Date  . Arthritis     History reviewed. No pertinent family history.  Social History   Socioeconomic History  . Marital status: Divorced    Spouse name: Not on file  . Number of children: Not on file  . Years of education: Not on file  . Highest education level: Not on file  Occupational History  . Not on file  Social Needs  . Financial resource strain: Not on file  . Food insecurity:    Worry: Not on file    Inability: Not on file  . Transportation needs:    Medical: Not on file    Non-medical: Not on file  Tobacco Use  . Smoking status: Never Smoker  . Smokeless tobacco: Never Used  Substance and Sexual Activity  . Alcohol use: Never    Frequency: Never  . Drug use: Never  . Sexual activity: Not on file  Lifestyle  . Physical activity:    Days per week: Not on file    Minutes per session: Not on file  . Stress: Not on file  Relationships  . Social connections:    Talks on phone: Not on file    Gets together: Not on file    Attends religious service: Not on file    Active member of club or organization: Not on file  Attends meetings of clubs or organizations: Not on file    Relationship status: Not on file  . Intimate partner violence:    Fear of current or ex partner: Not on file    Emotionally abused: Not on file    Physically abused: Not on file    Forced sexual activity: Not on file  Other Topics Concern  . Not on file  Social History Narrative  . Not on file    Past Medical History, Surgical history, Social history, and Family history were reviewed and updated as appropriate.   Please see review of systems for further details on the patient's review from today.   Objective:   Physical Exam:  There were no vitals taken for this visit.  Physical Exam   Constitutional: She is oriented to person, place, and time. She appears well-developed. No distress.  Musculoskeletal: She exhibits no deformity.  Neurological: She is alert and oriented to person, place, and time. She displays no tremor. Coordination and gait normal.  Psychiatric: Her speech is normal and behavior is normal. Judgment and thought content normal. Her mood appears anxious. Her affect is not angry, not blunt, not labile and not inappropriate. Cognition and memory are normal. She exhibits a depressed mood. She expresses no homicidal and no suicidal ideation. She expresses no suicidal plans and no homicidal plans.  Insight and judgment fair. No auditory or visual hallucinations. No delusions.  Tearful.    Lab Review:     Component Value Date/Time   NA 143 09/05/2017 1535   K 3.7 09/05/2017 1535   CL 106 09/05/2017 1535   CO2 29 09/05/2017 1535   GLUCOSE 99 09/05/2017 1535   BUN 14 09/05/2017 1535   CREATININE 0.69 09/05/2017 1535   CALCIUM 9.9 09/05/2017 1535   GFRNONAA >60 09/05/2017 1535   GFRAA >60 09/05/2017 1535       Component Value Date/Time   WBC 7.1 09/05/2017 1535   RBC 4.71 09/05/2017 1535   HGB 14.5 09/05/2017 1535   HCT 42.6 09/05/2017 1535   PLT 206 09/05/2017 1535   MCV 90.4 09/05/2017 1535   MCH 30.8 09/05/2017 1535   MCHC 34.0 09/05/2017 1535   RDW 11.6 09/05/2017 1535   LYMPHSABS 2.1 01/11/2009 1400   MONOABS 0.7 01/11/2009 1400   EOSABS 0.0 01/11/2009 1400   BASOSABS 0.0 01/11/2009 1400    No results found for: POCLITH, LITHIUM   No results found for: PHENYTOIN, PHENOBARB, VALPROATE, CBMZ   .res Assessment: Plan:    Major depressive disorder, recurrent episode, moderate (HCC)  Generalized anxiety disorder  Attention deficit hyperactivity disorder (ADHD), predominantly inattentive type   Disc stressor of retirment and change in schedule and relapse work.  Plans to work more PT and care for 2 gkids.  Sees them daily.   Needs  Klonopin to help with the stress.  She will try not to be excessively idle.  CBT for depression discussed. Activity management.  Push outside activity.   Disc med sensitivity, low dosages and differences in generics. conisder lithium.  Raise fluoxetine to 20 mg daily.  Call if any SE.  FU 8 weeks.  Lynder Parents, MD, DFAPA   Please see After Visit Summary for patient specific instructions.  No future appointments.  No orders of the defined types were placed in this encounter.     -------------------------------

## 2018-03-19 ENCOUNTER — Other Ambulatory Visit: Payer: Self-pay | Admitting: Psychiatry

## 2018-03-19 NOTE — Telephone Encounter (Signed)
Need to review chart

## 2018-03-23 ENCOUNTER — Other Ambulatory Visit: Payer: Self-pay

## 2018-03-23 MED ORDER — ALPRAZOLAM 0.5 MG PO TABS: 0.5000 mg | ORAL_TABLET | Freq: Every evening | ORAL | 1 refills | Status: DC | PRN

## 2018-04-06 ENCOUNTER — Encounter: Payer: Self-pay | Admitting: Emergency Medicine

## 2018-04-13 ENCOUNTER — Telehealth: Payer: Self-pay | Admitting: Psychiatry

## 2018-04-13 MED ORDER — FLUOXETINE HCL 20 MG PO TABS: 20.0000 mg | ORAL_TABLET | Freq: Every day | ORAL | 1 refills | Status: DC

## 2018-04-13 NOTE — Telephone Encounter (Signed)
Pt called ask if you would change Rx Fluoxetine to tablets. Now taking capsules. Having a lot of redness of the eyes. Meds have this effect from time to time. [place]

## 2018-04-13 NOTE — Telephone Encounter (Signed)
Will send update on pt's Fluoxetine 20mg  capsule rx to tablet, per pt preference. provider aware.

## 2018-05-07 ENCOUNTER — Encounter: Payer: Self-pay | Admitting: Psychiatry

## 2018-05-07 ENCOUNTER — Ambulatory Visit: Payer: Medicare HMO | Admitting: Psychiatry

## 2018-05-07 DIAGNOSIS — F900 Attention-deficit hyperactivity disorder, predominantly inattentive type: Secondary | ICD-10-CM | POA: Diagnosis not present

## 2018-05-07 DIAGNOSIS — F331 Major depressive disorder, recurrent, moderate: Secondary | ICD-10-CM

## 2018-05-07 DIAGNOSIS — F411 Generalized anxiety disorder: Secondary | ICD-10-CM

## 2018-05-07 NOTE — Progress Notes (Signed)
Alyssa Vaughn [id number] Feb 11, 1951 68 y.o.  Subjective:   Patient ID:  Alyssa Vaughn is a 68 y.o. (DOB 09/05/1950) female.  Chief Complaint:  Chief Complaint  Patient presents with  . Follow-up    Medication management    HPI last seen December 9 Alyssa Vaughn presents to the office today for follow-up of increased fluoxetine for worsening depression without reason.    Benefit from increased med with less crying spells.  Better stamina, less napping and less depression. Not recognized previous seasonal depression.  Change always gets to her.  Retired Jan 1 is a big change.  Good so far.  Still work PT for a freiend.  Change generic of fluoxetine last refill.  Change from tablets and capsules did not make difference with red eyes.  Grand kids in best.  Pleased she's been active.  Not generally self-motivated and pleased she's stayed busy.  Patient reports stable mood and denies depressed or irritable moods.  Patient denies any recent difficulty with anxiety.  Patient denies difficulty with sleep initiation or maintenance. Denies appetite disturbance.  Patient reports that energy and motivation have been good.  Patient denies any difficulty with concentration.  Patient denies any suicidal ideation.  No longer depressed and anxious.  Klonopin good once daily.  Xanax for sleep ok.  Review of Systems:  Review of Systems  Neurological: Negative for tremors and weakness.  Psychiatric/Behavioral: Negative for agitation, behavioral problems, confusion, decreased concentration, dysphoric mood, hallucinations, self-injury, sleep disturbance and suicidal ideas. The patient is not nervous/anxious and is not hyperactive.     Medications: I have reviewed the patient's current medications.  Current Outpatient Medications  Medication Sig Dispense Refill  . ALPRAZolam (XANAX) 0.5 MG tablet Take 1 tablet (0.5 mg total) by mouth at bedtime as needed for anxiety. 30 tablet 1  . b complex  vitamins capsule Take 1 capsule by mouth daily.    . clonazePAM (KLONOPIN) 0.5 MG tablet TAKE 1/2 TO 1 (ONE-HALF TO ONE) TABLET BY MOUTH ONCE DAILY AS NEEDED FOR  ANXIETY 20 tablet 1  . FLUoxetine (PROZAC) 20 MG tablet Take 1 tablet (20 mg total) by mouth daily. 90 tablet 1  . vitamin C (ASCORBIC ACID) 500 MG tablet Take 500 mg by mouth daily.     No current facility-administered medications for this visit.     Medication Side Effects: None  Allergies:  Allergies  Allergen Reactions  . Biaxin [Clarithromycin]     Upset stomach  . Nsaids Rash    Past Medical History:  Diagnosis Date  . Arthritis     History reviewed. No pertinent family history.  Social History   Socioeconomic History  . Marital status: Divorced    Spouse name: Not on file  . Number of children: Not on file  . Years of education: Not on file  . Highest education level: Not on file  Occupational History  . Not on file  Social Needs  . Financial resource strain: Not on file  . Food insecurity:    Worry: Not on file    Inability: Not on file  . Transportation needs:    Medical: Not on file    Non-medical: Not on file  Tobacco Use  . Smoking status: Never Smoker  . Smokeless tobacco: Never Used  Substance and Sexual Activity  . Alcohol use: Never    Frequency: Never  . Drug use: Never  . Sexual activity: Not on file  Lifestyle  . Physical activity:  Days per week: Not on file    Minutes per session: Not on file  . Stress: Not on file  Relationships  . Social connections:    Talks on phone: Not on file    Gets together: Not on file    Attends religious service: Not on file    Active member of club or organization: Not on file    Attends meetings of clubs or organizations: Not on file    Relationship status: Not on file  . Intimate partner violence:    Fear of current or ex partner: Not on file    Emotionally abused: Not on file    Physically abused: Not on file    Forced sexual  activity: Not on file  Other Topics Concern  . Not on file  Social History Narrative  . Not on file    Past Medical History, Surgical history, Social history, and Family history were reviewed and updated as appropriate.   Please see review of systems for further details on the patient's review from today.   Objective:   Physical Exam:  There were no vitals taken for this visit.  Physical Exam Constitutional:      General: She is not in acute distress.    Appearance: She is well-developed.  Musculoskeletal:        General: No deformity.  Neurological:     Mental Status: She is alert and oriented to person, place, and time.     Motor: No tremor.     Coordination: Coordination normal.     Gait: Gait normal.  Psychiatric:        Attention and Perception: Attention and perception normal.        Mood and Affect: Mood is not anxious or depressed. Affect is not labile, blunt, angry or inappropriate.        Speech: Speech normal.        Behavior: Behavior normal.        Thought Content: Thought content normal. Thought content does not include homicidal or suicidal ideation. Thought content does not include homicidal or suicidal plan.        Cognition and Memory: Cognition normal.        Judgment: Judgment normal.     Comments: Insight and judgment good. No auditory or visual hallucinations. No delusions.       Lab Review:     Component Value Date/Time   NA 143 09/05/2017 1535   K 3.7 09/05/2017 1535   CL 106 09/05/2017 1535   CO2 29 09/05/2017 1535   GLUCOSE 99 09/05/2017 1535   BUN 14 09/05/2017 1535   CREATININE 0.69 09/05/2017 1535   CALCIUM 9.9 09/05/2017 1535   GFRNONAA >60 09/05/2017 1535   GFRAA >60 09/05/2017 1535       Component Value Date/Time   WBC 7.1 09/05/2017 1535   RBC 4.71 09/05/2017 1535   HGB 14.5 09/05/2017 1535   HCT 42.6 09/05/2017 1535   PLT 206 09/05/2017 1535   MCV 90.4 09/05/2017 1535   MCH 30.8 09/05/2017 1535   MCHC 34.0 09/05/2017  1535   RDW 11.6 09/05/2017 1535   LYMPHSABS 2.1 01/11/2009 1400   MONOABS 0.7 01/11/2009 1400   EOSABS 0.0 01/11/2009 1400   BASOSABS 0.0 01/11/2009 1400    No results found for: POCLITH, LITHIUM   No results found for: PHENYTOIN, PHENOBARB, VALPROATE, CBMZ   .res Assessment: Plan:    Major depressive disorder, recurrent episode, moderate (HCC)  Generalized anxiety disorder  Attention deficit hyperactivity disorder (ADHD), predominantly inattentive type   Disc stressor of retirment and change in schedule and relapse  Prevention work.  Plans to work more PT and care for 2 gkids.  Sees them daily.   Needs Klonopin to help with the stress.  She will try not to be excessively idle.  CBT for depression discussed. Activity management.  Push outside activity.   Disc med sensitivity,   cont fluoxetine to 20 mg daily.  Call if any SE.  We discussed the short-term risks associated with benzodiazepines including sedation and increased fall risk among others.  Discussed long-term side effect risk including dependence, potential withdrawal symptoms, and the potential eventual dose-related risk of dementia.  FU 4 mos  Lynder Parents, MD, DFAPA   Please see After Visit Summary for patient specific instructions.  No future appointments.  No orders of the defined types were placed in this encounter.     -------------------------------

## 2018-05-15 ENCOUNTER — Other Ambulatory Visit: Payer: Self-pay | Admitting: Psychiatry

## 2018-05-15 NOTE — Telephone Encounter (Signed)
Last fill 04/17/2018 #20  Please submit. Any refills?

## 2018-05-15 NOTE — Telephone Encounter (Signed)
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  Will ok more clonazepam DT anxiety dealing with the family.  Refill once, keeping a close eye on it.  Lynder Parents, MD, DFAPA

## 2018-07-08 ENCOUNTER — Other Ambulatory Visit: Payer: Self-pay | Admitting: Psychiatry

## 2018-07-10 ENCOUNTER — Other Ambulatory Visit: Payer: Self-pay

## 2018-07-10 ENCOUNTER — Other Ambulatory Visit: Payer: Self-pay | Admitting: Psychiatry

## 2018-07-10 MED ORDER — ALPRAZOLAM 0.5 MG PO TABS: 0.5000 mg | ORAL_TABLET | Freq: Every evening | ORAL | 1 refills | Status: DC | PRN

## 2018-08-30 ENCOUNTER — Other Ambulatory Visit: Payer: Self-pay | Admitting: Psychiatry

## 2018-08-31 NOTE — Telephone Encounter (Signed)
Has appt 06/01

## 2018-09-03 ENCOUNTER — Encounter: Payer: Self-pay | Admitting: Psychiatry

## 2018-09-03 ENCOUNTER — Ambulatory Visit (INDEPENDENT_AMBULATORY_CARE_PROVIDER_SITE_OTHER): Payer: Medicare HMO | Admitting: Psychiatry

## 2018-09-03 ENCOUNTER — Other Ambulatory Visit: Payer: Self-pay | Admitting: Psychiatry

## 2018-09-03 ENCOUNTER — Other Ambulatory Visit: Payer: Self-pay

## 2018-09-03 DIAGNOSIS — F411 Generalized anxiety disorder: Secondary | ICD-10-CM

## 2018-09-03 DIAGNOSIS — F900 Attention-deficit hyperactivity disorder, predominantly inattentive type: Secondary | ICD-10-CM

## 2018-09-03 DIAGNOSIS — F3342 Major depressive disorder, recurrent, in full remission: Secondary | ICD-10-CM | POA: Diagnosis not present

## 2018-09-03 NOTE — Patient Instructions (Signed)
OK retrial modafinil. 100 mg 1/4 tablet for 10 days  In the morning and if needed increase to 1/2 tablet each morning

## 2018-09-03 NOTE — Progress Notes (Signed)
Alyssa Vaughn [id number] 03/10/1951 68 y.o.  Subjective:   Patient ID:  Alyssa Vaughn is a 68 y.o. (DOB 04-23-1950) female.  Chief Complaint:  Chief Complaint  Patient presents with  . Follow-up    Medication Management  . ADD    Medication Management  . Anxiety    Medication Management    Anxiety  Patient reports no confusion, decreased concentration, nervous/anxious behavior or suicidal ideas.     Alyssa Vaughn presents to the office today for follow-up of increased fluoxetine for worsening depression without reason.    Last seen May 08, 2018.  No meds were changed.  Fluoxetine 20 mg was continued.  Good overall.  Fluoxetine helps anxiety and tears. Only taking Xanax 1/2 tablet at night.  Xanax makes her sleepy but clonazepam doesn't so she can take clonazepam for anxiety with benefit.  Still ADD and head does not feel awake.  Hard to concentrate with groups.  But also doesn't feel quite awake and alert usually.   Feel like my brain won't wake up.  Asks about modafinil.  Has tried lots of ADD meds and didn't tolerate them.  Grand kids in best.  Pleased she's been active.  Not generally self-motivated and pleased she's stayed busy.  Patient reports stable mood and denies depressed or irritable moods.  Patient denies any recent difficulty with anxiety.  Patient denies difficulty with sleep initiation or maintenance. 7-8 hours.  Denies appetite disturbance.  Patient reports that energy and motivation have been good.  Patient denies any suicidal ideation.  No longer depressed and anxious.  Klonopin good once daily for anxiety.  Xanax for sleep ok.  Past Psychiatric Medication Trials: Modafinil brief, fluoxetine, clonazepam, Xanax, methylphenidate, bupropion, Dextrostat, Lexapro, sertraline 37.5 side effects, venlafaxine, duloxetine, Pristiq, buspirone, paroxetine, citalopram, Concerta, Adderall, Ritalin, Strattera, Deplin, Vyvanse, Evekeo, hydroxyzine  Review of  Systems:  Review of Systems  Neurological: Negative for tremors and weakness.  Psychiatric/Behavioral: Negative for agitation, behavioral problems, confusion, decreased concentration, dysphoric mood, hallucinations, self-injury, sleep disturbance and suicidal ideas. The patient is not nervous/anxious and is not hyperactive.     Medications: I have reviewed the patient's current medications.  Current Outpatient Medications  Medication Sig Dispense Refill  . ALPRAZolam (XANAX) 0.5 MG tablet Take 1 tablet (0.5 mg total) by mouth at bedtime as needed for anxiety. 30 tablet 1  . b complex vitamins capsule Take 1 capsule by mouth daily.    . clonazePAM (KLONOPIN) 0.5 MG tablet TAKE 1/2 TO 1 (ONE-HALF TO ONE) TABLET BY MOUTH ONCE DAILY AS NEEDED FOR ANXIETY 20 tablet 1  . FLUoxetine (PROZAC) 20 MG tablet Take 1 tablet (20 mg total) by mouth daily. 90 tablet 1  . vitamin C (ASCORBIC ACID) 500 MG tablet Take 500 mg by mouth daily.     No current facility-administered medications for this visit.     Medication Side Effects: None  Allergies:  Allergies  Allergen Reactions  . Biaxin [Clarithromycin]     Upset stomach  . Nsaids Rash    Past Medical History:  Diagnosis Date  . Arthritis     History reviewed. No pertinent family history.  Social History   Socioeconomic History  . Marital status: Divorced    Spouse name: Not on file  . Number of children: Not on file  . Years of education: Not on file  . Highest education level: Not on file  Occupational History  . Not on file  Social Needs  . Emergency planning/management officer  strain: Not on file  . Food insecurity:    Worry: Not on file    Inability: Not on file  . Transportation needs:    Medical: Not on file    Non-medical: Not on file  Tobacco Use  . Smoking status: Never Smoker  . Smokeless tobacco: Never Used  Substance and Sexual Activity  . Alcohol use: Never    Frequency: Never  . Drug use: Never  . Sexual activity: Not on file   Lifestyle  . Physical activity:    Days per week: Not on file    Minutes per session: Not on file  . Stress: Not on file  Relationships  . Social connections:    Talks on phone: Not on file    Gets together: Not on file    Attends religious service: Not on file    Active member of club or organization: Not on file    Attends meetings of clubs or organizations: Not on file    Relationship status: Not on file  . Intimate partner violence:    Fear of current or ex partner: Not on file    Emotionally abused: Not on file    Physically abused: Not on file    Forced sexual activity: Not on file  Other Topics Concern  . Not on file  Social History Narrative  . Not on file    Past Medical History, Surgical history, Social history, and Family history were reviewed and updated as appropriate.   Please see review of systems for further details on the patient's review from today.   Objective:   Physical Exam:  There were no vitals taken for this visit.  Physical Exam Constitutional:      General: She is not in acute distress.    Appearance: She is well-developed.  Musculoskeletal:        General: No deformity.  Neurological:     Mental Status: She is alert and oriented to person, place, and time.     Motor: No tremor.     Coordination: Coordination normal.     Gait: Gait normal.  Psychiatric:        Attention and Perception: Attention and perception normal.        Mood and Affect: Mood is not anxious or depressed. Affect is not labile, blunt, angry or inappropriate.        Speech: Speech normal.        Behavior: Behavior normal.        Thought Content: Thought content normal. Thought content does not include homicidal or suicidal ideation. Thought content does not include homicidal or suicidal plan.        Cognition and Memory: Cognition normal.        Judgment: Judgment normal.     Comments: Insight and judgment good. No auditory or visual hallucinations. No delusions.        Lab Review:     Component Value Date/Time   NA 143 09/05/2017 1535   K 3.7 09/05/2017 1535   CL 106 09/05/2017 1535   CO2 29 09/05/2017 1535   GLUCOSE 99 09/05/2017 1535   BUN 14 09/05/2017 1535   CREATININE 0.69 09/05/2017 1535   CALCIUM 9.9 09/05/2017 1535   GFRNONAA >60 09/05/2017 1535   GFRAA >60 09/05/2017 1535       Component Value Date/Time   WBC 7.1 09/05/2017 1535   RBC 4.71 09/05/2017 1535   HGB 14.5 09/05/2017 1535   HCT 42.6 09/05/2017 1535  PLT 206 09/05/2017 1535   MCV 90.4 09/05/2017 1535   MCH 30.8 09/05/2017 1535   MCHC 34.0 09/05/2017 1535   RDW 11.6 09/05/2017 1535   LYMPHSABS 2.1 01/11/2009 1400   MONOABS 0.7 01/11/2009 1400   EOSABS 0.0 01/11/2009 1400   BASOSABS 0.0 01/11/2009 1400    No results found for: POCLITH, LITHIUM   No results found for: PHENYTOIN, PHENOBARB, VALPROATE, CBMZ   .res Assessment: Plan:    Generalized anxiety disorder  Attention deficit hyperactivity disorder (ADHD), predominantly inattentive type  Depression, major, recurrent, in complete remission ([place])   Disc stressor of retirment and change in schedule and relapse  Prevention work.  Plans to work more PT and care for 2 gkids.  Sees them daily.   Needs Klonopin to help with the stress.  She will try not to be excessively idle.  CBT for depression discussed. Activity management.  Push outside activity.   Disc med sensitivity,   OK retrial modafinil. 100 mg 1/4 tablet for 10 days  In the morning and if needed increase to 1/2 tablet each morning.   Disc SE.  cont fluoxetine to 20 mg daily.  Call if any SE.  We discussed the short-term risks associated with benzodiazepines including sedation and increased fall risk among others.  Discussed long-term side effect risk including dependence, potential withdrawal symptoms, and the potential eventual dose-related risk of dementia.  FU 4 mos  Lynder Parents, MD, DFAPA   Please see After Visit Summary for patient  specific instructions.  Future Appointments  Date Time Provider Lillian  01/08/2019  9:00 AM Cottle, Billey Co., MD [facility] None    No orders of the defined types were placed in this encounter.     -------------------------------

## 2018-09-03 NOTE — Telephone Encounter (Signed)
Pt had visit today. Just checking to make sure that clonazepam and modafanil get sent over to the pharmacy for her.

## 2018-09-04 ENCOUNTER — Other Ambulatory Visit: Payer: Self-pay | Admitting: Psychiatry

## 2018-09-04 MED ORDER — MODAFINIL 100 MG PO TABS: ORAL_TABLET | ORAL | 1 refills | Status: DC

## 2018-09-04 NOTE — Telephone Encounter (Signed)
I apparently had forgotten to send the modafinil to [facility] but that has been done.  We please let the patient know.

## 2018-09-04 NOTE — Telephone Encounter (Signed)
Clonazepam prescription was sent in yesterday successfully apparently.  I am not sure what this error messages about.

## 2018-09-04 NOTE — Telephone Encounter (Signed)
Left pt. A detailed VM. Asked to call back with any questions.

## 2018-09-14 ENCOUNTER — Other Ambulatory Visit: Payer: Self-pay

## 2018-09-14 MED ORDER — ALPRAZOLAM 0.5 MG PO TABS: 0.5000 mg | ORAL_TABLET | Freq: Every evening | ORAL | 2 refills | Status: DC | PRN

## 2018-09-17 ENCOUNTER — Other Ambulatory Visit: Payer: Self-pay | Admitting: Psychiatry

## 2018-09-19 ENCOUNTER — Other Ambulatory Visit: Payer: Self-pay | Admitting: Psychiatry

## 2018-09-30 ENCOUNTER — Other Ambulatory Visit: Payer: Self-pay | Admitting: Psychiatry

## 2018-10-01 NOTE — Telephone Encounter (Signed)
Ok for refills monthly?

## 2018-11-22 ENCOUNTER — Other Ambulatory Visit: Payer: Self-pay | Admitting: Psychiatry

## 2018-11-23 NOTE — Telephone Encounter (Signed)
Next appt 10/06 Last appt 06/02

## 2018-11-29 ENCOUNTER — Other Ambulatory Visit: Payer: Self-pay | Admitting: Psychiatry

## 2018-11-30 ENCOUNTER — Other Ambulatory Visit: Payer: Self-pay | Admitting: Obstetrics & Gynecology

## 2018-11-30 DIAGNOSIS — N6320 Unspecified lump in the left breast, unspecified quadrant: Secondary | ICD-10-CM

## 2018-12-12 ENCOUNTER — Other Ambulatory Visit: Payer: BC Managed Care – PPO

## 2018-12-21 ENCOUNTER — Other Ambulatory Visit: Payer: BC Managed Care – PPO

## 2018-12-21 ENCOUNTER — Other Ambulatory Visit: Payer: Self-pay

## 2018-12-21 ENCOUNTER — Ambulatory Visit
Admission: RE | Admit: 2018-12-21 | Discharge: 2018-12-21 | Disposition: A | Discharge status: Home / Self Care | Payer: BC Managed Care – PPO | Source: Ambulatory Visit | Attending: Obstetrics & Gynecology | Admitting: Obstetrics & Gynecology

## 2018-12-21 ENCOUNTER — Ambulatory Visit
Admission: RE | Admit: 2018-12-21 | Discharge: 2018-12-21 | Disposition: A | Discharge status: Home / Self Care | Payer: Medicare HMO | Source: Ambulatory Visit | Attending: Obstetrics & Gynecology | Admitting: Obstetrics & Gynecology

## 2018-12-21 DIAGNOSIS — N6320 Unspecified lump in the left breast, unspecified quadrant: Secondary | ICD-10-CM

## 2018-12-25 ENCOUNTER — Other Ambulatory Visit: Payer: Self-pay | Admitting: Psychiatry

## 2018-12-25 NOTE — Telephone Encounter (Signed)
appt 10/06 any refills?

## 2018-12-31 ENCOUNTER — Other Ambulatory Visit: Payer: Self-pay

## 2018-12-31 MED ORDER — ALPRAZOLAM 0.5 MG PO TABS: 0.5000 mg | ORAL_TABLET | Freq: Every evening | ORAL | 2 refills | Status: DC | PRN

## 2019-01-04 ENCOUNTER — Other Ambulatory Visit: Payer: BC Managed Care – PPO

## 2019-01-08 ENCOUNTER — Ambulatory Visit (INDEPENDENT_AMBULATORY_CARE_PROVIDER_SITE_OTHER): Payer: Medicare HMO | Admitting: Psychiatry

## 2019-01-08 ENCOUNTER — Other Ambulatory Visit: Payer: Self-pay

## 2019-01-08 ENCOUNTER — Encounter: Payer: Self-pay | Admitting: Psychiatry

## 2019-01-08 DIAGNOSIS — F411 Generalized anxiety disorder: Secondary | ICD-10-CM

## 2019-01-08 DIAGNOSIS — F900 Attention-deficit hyperactivity disorder, predominantly inattentive type: Secondary | ICD-10-CM

## 2019-01-08 DIAGNOSIS — F331 Major depressive disorder, recurrent, moderate: Secondary | ICD-10-CM | POA: Diagnosis not present

## 2019-01-08 NOTE — Progress Notes (Signed)
Alyssa Vaughn [id number] Nov 22, 1950 68 y.o.  Subjective:   Patient ID:  Alyssa Vaughn is a 68 y.o. (DOB [birth year]) female.  Chief Complaint:  Chief Complaint  Patient presents with  . Follow-up    med mangement  . ADHD    Anxiety Patient reports no confusion, decreased concentration, nervous/anxious behavior or suicidal ideas.     Alyssa Vaughn presents to the office today for follow-up of increased fluoxetine for worsening depression without reason.    Last seen June 2020.  No meds were changed.  Fluoxetine 20 mg was continued.  Good overall.  Fluoxetine helps anxiety and tears. Only taking Xanax 1/2 tablet at night.  Xanax makes her sleepy but clonazepam doesn't so she can take clonazepam for anxiety with benefit.  Still ADD and head does not feel awake.  Hard to concentrate with groups.  But also doesn't feel quite awake and alert usually.   Feel like my brain won't wake up.  Tried modafinil 50 mg but nervous so much couldn't evaluate effect on ADD.  Has tried lots of ADD meds and didn't tolerate them.  Grand kids in best.  Pleased she's been active.  Not generally self-motivated and pleased she's stayed busy.  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  I just have to let it go.   Can't focus on [place].  Recognizes she craves his attention.  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  Don't need clonazepam at any other time.  Patient denies difficulty with sleep initiation or maintenance. 7-8 hours.  Denies appetite disturbance.  Patient reports that energy and motivation have been good.  Patient denies any suicidal ideation.  No longer depressed and anxious.  Klonopin good once daily for anxiety.  Xanax for sleep ok.  Past Psychiatric Medication Trials: Modafinil SE, methylphenidate, bupropion, Dextrostat, Adderall, Ritalin, Strattera, Deplin, Vyvanse, Evekeo, Concerta, fluoxetine,  clonazepam, Xanax,  Lexapro, sertraline 37.5 side effects, venlafaxine, duloxetine, Pristiq, buspirone, paroxetine, citalopram,   hydroxyzine  Review of Systems:  Review of Systems  Musculoskeletal: Positive for back pain.  Neurological: Negative for tremors and weakness.  Psychiatric/Behavioral: Negative for agitation, behavioral problems, confusion, decreased concentration, dysphoric mood, hallucinations, self-injury, sleep disturbance and suicidal ideas. The patient is not nervous/anxious and is not hyperactive.     Medications: I have reviewed the patient's current medications.  Current Outpatient Medications  Medication Sig Dispense Refill  . ALPRAZolam (XANAX) 0.5 MG tablet Take 1 tablet (0.5 mg total) by mouth at bedtime as needed for anxiety. (Patient taking differently: Take 0.25-0.5 mg by mouth at bedtime as needed for anxiety or sleep (for sleep). ) 30 tablet 2  . b complex vitamins capsule Take 1 capsule by mouth daily.    . clonazePAM (KLONOPIN) 0.5 MG tablet TAKE 1/2 TO 1 (ONE-HALF TO ONE) TABLET BY MOUTH ONCE DAILY AS NEEDED FOR ANXIETY 20 tablet 0  . FLUoxetine (PROZAC) 20 MG capsule Take 1 capsule by mouth once daily 90 capsule 1  . vitamin C (ASCORBIC ACID) 500 MG tablet Take 500 mg by mouth daily.     No current facility-administered medications for this visit.     Medication Side Effects: None  Allergies:  Allergies  Allergen Reactions  . Biaxin [Clarithromycin]     Upset stomach  . Nsaids Rash    Past Medical History:  Diagnosis Date  . Arthritis     History reviewed. No pertinent family history.  Social History   Socioeconomic History  . Marital status: Divorced    Spouse name: Not on file  . Number of children: Not on file  . Years of education: Not on file  . Highest education level: Not on file  Occupational History  . Not on file  Social Needs  . Financial resource strain: Not on file  . Food insecurity    Worry: Not on file    Inability:  Not on file  . Transportation needs    Medical: Not on file    Non-medical: Not on file  Tobacco Use  . Smoking status: Never Smoker  . Smokeless tobacco: Never Used  Substance and Sexual Activity  . Alcohol use: Never    Frequency: Never  . Drug use: Never  . Sexual activity: Not on file  Lifestyle  . Physical activity    Days per week: Not on file    Minutes per session: Not on file  . Stress: Not on file  Relationships  . Social Herbalist on phone: Not on file    Gets together: Not on file    Attends religious service: Not on file    Active member of club or organization: Not on file    Attends meetings of clubs or organizations: Not on file    Relationship status: Not on file  . Intimate partner violence    Fear of current or ex partner: Not on file    Emotionally abused: Not on file    Physically abused: Not on file    Forced sexual activity: Not on file  Other Topics Concern  . Not on file  Social History Narrative  . Not on file    Past Medical History, Surgical history, Social history, and Family history were reviewed and updated as appropriate.   Please see review of systems for further details on the patient's review from today.   Objective:   Physical Exam:  There were no vitals taken for this visit.  Physical Exam Constitutional:      General: She is not in acute distress.    Appearance: She is well-developed.  Musculoskeletal:        General: No deformity.  Neurological:     Mental Status: She is alert and oriented to person, place, and time.     Motor: No tremor.     Coordination: Coordination normal.     Gait: Gait normal.  Psychiatric:        Attention and Perception: Attention and perception normal.        Mood and Affect: Mood is anxious. Mood is not depressed. Affect is not labile, blunt, angry or inappropriate.        Speech: Speech normal.        Behavior: Behavior normal.        Thought Content: Thought content normal.  Thought content does not include homicidal or suicidal ideation. Thought content does not include homicidal or suicidal plan.        Cognition and Memory: Cognition normal.        Judgment: Judgment normal.     Comments: Insight and judgment good. No auditory or visual hallucinations. No delusions.  More anxiety and dysphoria with the situation      Lab Review:     Component Value Date/Time   NA 143 09/05/2017 1535   K 3.7 09/05/2017 1535   CL 106 09/05/2017 1535   CO2 29 09/05/2017 1535   GLUCOSE 99  09/05/2017 1535   BUN 14 09/05/2017 1535   CREATININE 0.69 09/05/2017 1535   CALCIUM 9.9 09/05/2017 1535   GFRNONAA >60 09/05/2017 1535   GFRAA >60 09/05/2017 1535       Component Value Date/Time   WBC 7.1 09/05/2017 1535   RBC 4.71 09/05/2017 1535   HGB 14.5 09/05/2017 1535   HCT 42.6 09/05/2017 1535   PLT 206 09/05/2017 1535   MCV 90.4 09/05/2017 1535   MCH 30.8 09/05/2017 1535   MCHC 34.0 09/05/2017 1535   RDW 11.6 09/05/2017 1535   LYMPHSABS 2.1 01/11/2009 1400   MONOABS 0.7 01/11/2009 1400   EOSABS 0.0 01/11/2009 1400   BASOSABS 0.0 01/11/2009 1400    No results found for: POCLITH, LITHIUM   No results found for: PHENYTOIN, PHENOBARB, VALPROATE, CBMZ   .res Assessment: Plan:    Generalized anxiety disorder  Major depressive disorder, recurrent episode, moderate (HCC)  Attention deficit hyperactivity disorder (ADHD), predominantly inattentive type   Disc stressor of retirment and change in schedule and relapse  Prevention work.  Plans to work more PT and care for 2 gkids.  Sees them daily.   Encourage her to protect herself with boundaries.  Needs Klonopin to help with the stress.  She will try not to be excessively idle.  CBT for depression discussed. Activity management.  Push outside activity.   Disc med sensitivity,   Failed multiple stimulants DT tolerability px.  Probably can't afford liquid stimulant which is the only option left.  Gurney Maxin.  cont  fluoxetine to 20 mg daily.  Call if any SE.  We discussed the short-term risks associated with benzodiazepines including sedation and increased fall risk among others.  Discussed long-term side effect risk including dependence, potential withdrawal symptoms, and the potential eventual dose-related risk of dementia.  Disc unusual combo stimulants but is serving good medical purpose.  FU 6  mos  Lynder Parents, MD, DFAPA   Please see After Visit Summary for patient specific instructions.  No future appointments.  No orders of the defined types were placed in this encounter.     -------------------------------

## 2019-01-15 DIAGNOSIS — F419 Anxiety disorder, unspecified: Secondary | ICD-10-CM | POA: Insufficient documentation

## 2019-01-23 ENCOUNTER — Other Ambulatory Visit: Payer: Self-pay | Admitting: Psychiatry

## 2019-01-24 NOTE — Telephone Encounter (Signed)
Refills

## 2019-03-17 ENCOUNTER — Other Ambulatory Visit: Payer: Self-pay | Admitting: Psychiatry

## 2019-05-13 ENCOUNTER — Ambulatory Visit: Payer: BC Managed Care – PPO | Attending: Internal Medicine

## 2019-05-13 DIAGNOSIS — Z23 Encounter for immunization: Secondary | ICD-10-CM | POA: Insufficient documentation

## 2019-05-13 NOTE — Progress Notes (Signed)
    Covid-19 Vaccination Clinic  Name:  Alyssa Vaughn    MRN: [id number] DOB: September 21, 1950  05/13/2019  [name] was observed post Covid-19 immunization for 15 minutes without incidence. She was provided with Vaccine Information Sheet and instruction to access the V-Safe system.   [name] was instructed to call 911 with any severe reactions post vaccine: Marland Kitchen Difficulty breathing  . Swelling of your face and throat  . A fast heartbeat  . A bad rash all over your body  . Dizziness and weakness    Immunizations Administered    Name Date Dose VIS Date Route   Pfizer COVID-19 Vaccine 05/13/2019  5:42 PM 0.3 mL 03/15/2019 Intramuscular   Manufacturer: [place]   Lot: [id number]   [place]: [id number]

## 2019-05-27 ENCOUNTER — Other Ambulatory Visit: Payer: Self-pay | Admitting: Psychiatry

## 2019-06-07 ENCOUNTER — Ambulatory Visit: Payer: BC Managed Care – PPO | Attending: Internal Medicine

## 2019-06-07 DIAGNOSIS — Z23 Encounter for immunization: Secondary | ICD-10-CM | POA: Insufficient documentation

## 2019-06-07 NOTE — Progress Notes (Signed)
   Covid-19 Vaccination Clinic  Name:  Alyssa Vaughn    MRN: [id number] DOB: 1950/07/08  06/07/2019  Alyssa Vaughn was observed post Covid-19 immunization for 15 minutes without incident. She was provided with Vaccine Information Sheet and instruction to access the V-Safe system.   Alyssa Vaughn was instructed to call 911 with any severe reactions post vaccine: Marland Kitchen Difficulty breathing  . Swelling of face and throat  . A fast heartbeat  . A bad rash all over body  . Dizziness and weakness   Immunizations Administered    Name Date Dose VIS Date Route   Pfizer COVID-19 Vaccine 06/07/2019  4:24 PM 0.3 mL 03/15/2019 Intramuscular   Manufacturer: [place]   Lot: [id number]   [place]: [id number]

## 2019-06-24 ENCOUNTER — Telehealth: Payer: Self-pay | Admitting: Psychiatry

## 2019-06-24 NOTE — Telephone Encounter (Signed)
Pt stated Dr Clovis Pu gave her or suggested Nac. Is this a vitamin? Please advise.

## 2019-06-25 NOTE — Telephone Encounter (Signed)
Pt. Made aware.

## 2019-07-01 ENCOUNTER — Other Ambulatory Visit: Payer: Self-pay | Admitting: Psychiatry

## 2019-07-09 ENCOUNTER — Encounter: Payer: Self-pay | Admitting: Psychiatry

## 2019-07-09 ENCOUNTER — Ambulatory Visit (INDEPENDENT_AMBULATORY_CARE_PROVIDER_SITE_OTHER): Payer: Medicare HMO | Admitting: Psychiatry

## 2019-07-09 ENCOUNTER — Other Ambulatory Visit: Payer: Self-pay

## 2019-07-09 DIAGNOSIS — F331 Major depressive disorder, recurrent, moderate: Secondary | ICD-10-CM

## 2019-07-09 DIAGNOSIS — F5105 Insomnia due to other mental disorder: Secondary | ICD-10-CM

## 2019-07-09 DIAGNOSIS — F900 Attention-deficit hyperactivity disorder, predominantly inattentive type: Secondary | ICD-10-CM | POA: Diagnosis not present

## 2019-07-09 DIAGNOSIS — F411 Generalized anxiety disorder: Secondary | ICD-10-CM | POA: Diagnosis not present

## 2019-07-09 MED ORDER — QUILLIVANT XR 25 MG/5ML PO SRER: 5.0000 mL | Freq: Every morning | ORAL | 0 refills | Status: DC

## 2019-07-09 MED ORDER — ALPRAZOLAM 0.5 MG PO TABS: 0.2500 mg | ORAL_TABLET | Freq: Every evening | ORAL | 2 refills | Status: DC | PRN

## 2019-07-09 NOTE — Progress Notes (Signed)
Alyssa Vaughn [id number] 20-May-1950 69 y.o.  Subjective:   Patient ID:  Alyssa Vaughn is a 69 y.o. (DOB 11-25-1950) female.  Chief Complaint:  Chief Complaint  Patient presents with  . Depression  . Anxiety  . Medication Problem  . Follow-up    Anxiety Patient reports no confusion, decreased concentration, nervous/anxious behavior, palpitations or suicidal ideas.     Alyssa Vaughn presents to the office today for follow-up of increased fluoxetine for worsening depression without reason.    Last seen October 2020.  No meds were changed.  Fluoxetine 20 mg was continued.  Not wanting to take clonazepam bc now too sleepy with it.    Wants to try Quillivent bc easily confused, distracted and unfocused as noted.  Good overall.  Fluoxetine helps anxiety and tears. Only taking Xanax 1/2 tablet at night.   Still ADD and head does not feel awake.  Hard to concentrate with groups.  But also doesn't feel quite awake and alert usually.   Feel like my brain won't wake up.  Tried modafinil 50 mg but nervous so much couldn't evaluate effect on ADD.  Has tried lots of ADD meds and didn't tolerate them.  Grand kids in best.  [name] murdered but he hasn't been in the picture.  Pleased she's been active.  Not generally self-motivated and pleased she's stayed busy.  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  I just have to let it go.   Can't focus on [place].  She periodically struggles with sadness over the brokenness in the relationship.  Recognizes she craves his attention.  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  Don't need clonazepam at any other time.  Patient denies difficulty with sleep initiation or maintenance. 7-8 hours.  Denies appetite disturbance.  Patient reports that energy and motivation have been good.  Patient denies any suicidal ideation.  No longer depressed usually and anxious  but episodic.  Klonopin good once daily for anxiety.  Xanax for sleep ok.  Past Psychiatric Medication Trials: Modafinil SE, methylphenidate tablets, bupropion, Dextrostat, Adderall, Ritalin, Strattera, Deplin, Vyvanse, Evekeo, Concerta, fluoxetine, clonazepam, Xanax,  Lexapro, sertraline 37.5 side effects, venlafaxine, duloxetine, Pristiq, buspirone, paroxetine, citalopram,   hydroxyzine  Review of Systems:  Review of Systems  Cardiovascular: Negative for palpitations.  Musculoskeletal: Positive for back pain.  Neurological: Negative for tremors and weakness.  Psychiatric/Behavioral: Negative for agitation, behavioral problems, confusion, decreased concentration, dysphoric mood, hallucinations, self-injury, sleep disturbance and suicidal ideas. The patient is not nervous/anxious and is not hyperactive.     Medications: I have reviewed the patient's current medications.  Current Outpatient Medications  Medication Sig Dispense Refill  . b complex vitamins capsule Take 1 capsule by mouth daily.    Marland Kitchen FLUoxetine (PROZAC) 20 MG capsule Take 1 capsule by mouth once daily 90 capsule 2  . vitamin C (ASCORBIC ACID) 500 MG tablet Take 500 mg by mouth daily.    Marland Kitchen ALPRAZolam (XANAX) 0.5 MG tablet Take 0.5-1 tablets (0.25-0.5 mg total) by mouth at bedtime as needed for anxiety or sleep (for sleep). 30 tablet 2  . clonazePAM (KLONOPIN) 0.5 MG tablet TAKE 1/2 T0 1 TABLET BY MOUTH ONCE DAILY AS NEEDED FOR ANXIETY (Patient not taking: Reported on 07/09/2019) 20 tablet 1  . Methylphenidate HCl ER (QUILLIVANT XR) 25 MG/5ML SRER Take 5 mLs by mouth in the morning. 120 mL 0   No current facility-administered medications  for this visit.    Medication Side Effects: None  Allergies:  Allergies  Allergen Reactions  . Biaxin [Clarithromycin]     Upset stomach  . Nsaids Rash    Past Medical History:  Diagnosis Date  . Arthritis     History reviewed. No pertinent family history.  Social History    Socioeconomic History  . Marital status: Divorced    Spouse name: Not on file  . Number of children: Not on file  . Years of education: Not on file  . Highest education level: Not on file  Occupational History  . Not on file  Tobacco Use  . Smoking status: Never Smoker  . Smokeless tobacco: Never Used  Substance and Sexual Activity  . Alcohol use: Never  . Drug use: Never  . Sexual activity: Not on file  Other Topics Concern  . Not on file  Social History Narrative  . Not on file   Social Determinants of Health   Financial Resource Strain:   . Difficulty of Paying Living Expenses:   Food Insecurity:   . Worried About Charity fundraiser in the Last Year:   . Arboriculturist in the Last Year:   Transportation Needs:   . Film/video editor (Medical):   Marland Kitchen Lack of Transportation (Non-Medical):   Physical Activity:   . Days of Exercise per Week:   . Minutes of Exercise per Session:   Stress:   . Feeling of Stress :   Social Connections:   . Frequency of Communication with Friends and Family:   . Frequency of Social Gatherings with Friends and Family:   . Attends Religious Services:   . Active Member of Clubs or Organizations:   . Attends Archivist Meetings:   Marland Kitchen Marital Status:   Intimate Partner Violence:   . Fear of Current or Ex-Partner:   . Emotionally Abused:   Marland Kitchen Physically Abused:   . Sexually Abused:     Past Medical History, Surgical history, Social history, and Family history were reviewed and updated as appropriate.   Please see review of systems for further details on the patient's review from today.   Objective:   Physical Exam:  There were no vitals taken for this visit.  Physical Exam Constitutional:      General: She is not in acute distress.    Appearance: She is well-developed.  Musculoskeletal:        General: No deformity.  Neurological:     Mental Status: She is alert and oriented to person, place, and time.     Motor:  No tremor.     Coordination: Coordination normal.     Gait: Gait normal.  Psychiatric:        Attention and Perception: Attention and perception normal.        Mood and Affect: Mood is anxious. Mood is not depressed. Affect is not labile, blunt, angry or inappropriate.        Speech: Speech normal.        Behavior: Behavior normal.        Thought Content: Thought content normal. Thought content does not include homicidal or suicidal ideation. Thought content does not include homicidal or suicidal plan.        Cognition and Memory: Cognition normal.        Judgment: Judgment normal.     Comments: Insight and judgment good. No auditory or visual hallucinations. No delusions.  More anxiety and dysphoria with the  situation      Lab Review:     Component Value Date/Time   NA 143 09/05/2017 1535   K 3.7 09/05/2017 1535   CL 106 09/05/2017 1535   CO2 29 09/05/2017 1535   GLUCOSE 99 09/05/2017 1535   BUN 14 09/05/2017 1535   CREATININE 0.69 09/05/2017 1535   CALCIUM 9.9 09/05/2017 1535   GFRNONAA >60 09/05/2017 1535   GFRAA >60 09/05/2017 1535       Component Value Date/Time   WBC 7.1 09/05/2017 1535   RBC 4.71 09/05/2017 1535   HGB 14.5 09/05/2017 1535   HCT 42.6 09/05/2017 1535   PLT 206 09/05/2017 1535   MCV 90.4 09/05/2017 1535   MCH 30.8 09/05/2017 1535   MCHC 34.0 09/05/2017 1535   RDW 11.6 09/05/2017 1535   LYMPHSABS 2.1 01/11/2009 1400   MONOABS 0.7 01/11/2009 1400   EOSABS 0.0 01/11/2009 1400   BASOSABS 0.0 01/11/2009 1400    No results found for: POCLITH, LITHIUM   No results found for: PHENYTOIN, PHENOBARB, VALPROATE, CBMZ   .res Assessment: Plan:    Major depressive disorder, recurrent episode, moderate (HCC)  Attention deficit hyperactivity disorder (ADHD), predominantly inattentive type - Plan: Methylphenidate HCl ER (QUILLIVANT XR) 25 MG/5ML SRER  Generalized anxiety disorder  Insomnia due to mental condition - Plan: ALPRAZolam (XANAX) 0.5 MG  tablet, DISCONTINUED: ALPRAZolam (XANAX) 0.5 MG tablet   Disc stressor of retirment and change in schedule and relapse  Prevention work.  Plans to work more PT and care for 2 gkids.  Sees them daily.   Encourage her to protect herself with boundaries.  Needs Klonopin to help with the stress.  She will try not to be excessively idle.  CBT for depression discussed. Activity management.  Push outside activity.   Disc med sensitivity,   Failed multiple stimulants DT tolerability px.  Probably can't afford liquid stimulant which is the only option left.  Gurney Maxin.  Start Quillivant 1 ml daily and may increase by 1 ml weekly as tolerated up to about 3-4 ml   cont fluoxetine to 20 mg daily.  Call if any SE.  Disc the off-label use of N-Acetylcysteine at 600 mg daily to help with mild cognitive problems.  It can be combined with a B-complex vitamin as the B-12 and folate have been shown to sometimes enhance the effect.  We discussed the short-term risks associated with benzodiazepines including sedation and increased fall risk among others.  Discussed long-term side effect risk including dependence, potential withdrawal symptoms, and the potential eventual dose-related risk of dementia.  Disc unusual combo stimulants but is serving good medical purpose.  FU 2  mos  Lynder Parents, MD, DFAPA   Please see After Visit Summary for patient specific instructions.  No future appointments.  No orders of the defined types were placed in this encounter.     -------------------------------

## 2019-07-09 NOTE — Patient Instructions (Addendum)
Start Quillivant 1 ml daily and may increase by 1 ml weekly as tolerated up to about 3-4 ml   (NAC)  N-Acetylcysteine at 600 mg daily to help with mild cognitive problems.  It can be combined with a B-complex vitamin as the B-12 and folate have been shown to sometimes enhance the effect.

## 2019-07-11 ENCOUNTER — Telehealth: Payer: Self-pay | Admitting: Psychiatry

## 2019-07-11 NOTE — Telephone Encounter (Signed)
She's 68 and I suppose on MCR.  If so can't use card.  I wrote for much more than she'll use in a month so keep that in mind.  1 bottle might last 3 mos or more

## 2019-07-11 NOTE — Telephone Encounter (Signed)
Pt called and stated she went to pick up [place] from [facility]. The price was $348. Pt wants to know if we have any discount cards that can be used?

## 2019-07-12 NOTE — Telephone Encounter (Signed)
I just received a prior authorization for this medication so they may help her costs

## 2019-07-12 NOTE — Telephone Encounter (Signed)
Prior authorization submitted and approved for QUILLIVANT XR 25 MG/5ML through [facility] Part D effective 04/05/2019-04/03/2020  Submitted through cover my meds

## 2019-07-12 NOTE — Telephone Encounter (Signed)
[facility] notified.

## 2019-07-12 NOTE — Telephone Encounter (Signed)
Left patient information and to call back with more questions or concerns. Patient does have Medicare

## 2019-09-03 ENCOUNTER — Telehealth: Payer: Self-pay | Admitting: Psychiatry

## 2019-09-03 ENCOUNTER — Other Ambulatory Visit: Payer: Self-pay | Admitting: Psychiatry

## 2019-09-03 MED ORDER — METHYLPHENIDATE HCL 5 MG PO TABS: 5.0000 mg | ORAL_TABLET | Freq: Two times a day (BID) | ORAL | 0 refills | Status: DC

## 2019-09-03 NOTE — Telephone Encounter (Signed)
Ok sent Ritalin 5 BID

## 2019-09-03 NOTE — Telephone Encounter (Signed)
Pt is not on Quillivant b/c of high cost, but she does tolerate it. She would like Dr. Clovis Pu to send in an RX for Ritalin 5mg  for her instead. She has checked with insur and it is covered. Send to [facility] on [address].

## 2019-09-08 ENCOUNTER — Telehealth: Payer: Self-pay

## 2019-09-08 NOTE — Telephone Encounter (Signed)
Prior authorization submitted and approved for METHYLPHENIDATE 5 MG TABLETS #60 effective 09/05/2019-09/05/2022 With [facility]

## 2019-09-09 ENCOUNTER — Other Ambulatory Visit: Payer: Self-pay

## 2019-09-09 ENCOUNTER — Encounter: Payer: Self-pay | Admitting: Psychiatry

## 2019-09-09 ENCOUNTER — Ambulatory Visit (INDEPENDENT_AMBULATORY_CARE_PROVIDER_SITE_OTHER): Payer: Medicare HMO | Admitting: Psychiatry

## 2019-09-09 DIAGNOSIS — F5105 Insomnia due to other mental disorder: Secondary | ICD-10-CM | POA: Diagnosis not present

## 2019-09-09 DIAGNOSIS — F411 Generalized anxiety disorder: Secondary | ICD-10-CM | POA: Diagnosis not present

## 2019-09-09 DIAGNOSIS — F900 Attention-deficit hyperactivity disorder, predominantly inattentive type: Secondary | ICD-10-CM | POA: Diagnosis not present

## 2019-09-09 DIAGNOSIS — F331 Major depressive disorder, recurrent, moderate: Secondary | ICD-10-CM

## 2019-09-09 MED ORDER — METHYLPHENIDATE HCL 5 MG PO TABS: 5.0000 mg | ORAL_TABLET | Freq: Three times a day (TID) | ORAL | 0 refills | Status: DC

## 2019-09-09 MED ORDER — ALPRAZOLAM 0.5 MG PO TABS: 0.2500 mg | ORAL_TABLET | Freq: Every evening | ORAL | 1 refills | Status: DC | PRN

## 2019-09-09 MED ORDER — FLUOXETINE HCL 20 MG PO CAPS: 20.0000 mg | ORAL_CAPSULE | Freq: Every day | ORAL | 3 refills | Status: DC

## 2019-09-09 NOTE — Progress Notes (Signed)
Alyssa Vaughn [id number] [birth year] 69 y.o.  Subjective:   Patient ID:  Alyssa Vaughn is a 69 y.o. (DOB 1950/04/29) female.  Chief Complaint:  No chief complaint on file.   Anxiety Patient reports no confusion, decreased concentration, nervous/anxious behavior, palpitations or suicidal ideas.     TIKESHA MORT presents to the office today for follow-up of increased fluoxetine for worsening depression without reason.    Last seen October 2020.  No meds were changed.  Fluoxetine 20 mg was continued.  Not wanting to take clonazepam bc now too sleepy with it.    07/09/2019 appointment the following is noted: Wants to try Quillivent bc easily confused, distracted and unfocused as noted. Good overall.  Fluoxetine helps anxiety and tears. Only taking Xanax 1/2 tablet at night.   Still ADD and head does not feel awake.  Hard to concentrate with groups.  But also doesn't feel quite awake and alert usually.   Feel like my brain won't wake up.  Tried modafinil 50 mg but nervous so much couldn't evaluate effect on ADD.  Has tried lots of ADD meds and didn't tolerate them. Grand kids in best.  [name] murdered but he hasn't been in the picture.  Pleased she's been active.  Not generally self-motivated and pleased she's stayed busy. 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  I just have to let it go.   Can't focus on [place].  She periodically struggles with sadness over the brokenness in the relationship.  Recognizes she craves his attention.  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  Don't need clonazepam at any other time.  Patient denies difficulty with sleep initiation or maintenance. 7-8 hours.  Denies appetite disturbance.  Patient reports that energy and motivation have been good.  Patient denies any suicidal ideation.  No longer depressed usually and anxious but episodic.  Klonopin good once daily  for anxiety.  Xanax for sleep ok. Plan: Start Quillivant 1 ml daily and may increase by 1 ml weekly as tolerated up to about 3-4 ml  09/03/2019 phone call that the [facility] was too expensive and wanted a prescription for Ritalin 5 mg which was sent.  09/09/2019 appointment with the following noted: Ritalin helpful.  takiing up to BID and it makes a big difference in focus and productivity. Tolerating it well.   Taking a little alprazolam at night usually about 1/4 of 0.5 mg night.y.  Not taking clonazepam. Asks about taper BZ. Overall feels the best she's been.  After years of divorced she feels more free. Less fear spiritually now.  Past Psychiatric Medication Trials: Modafinil SE, methylphenidate tablets, bupropion, Dextrostat, Adderall, Ritalin, Strattera, Deplin, Vyvanse, Evekeo, Concerta, fluoxetine, clonazepam, Xanax,  Lexapro, sertraline 37.5 side effects, venlafaxine, duloxetine, Pristiq, buspirone, paroxetine, citalopram,   hydroxyzine  Review of Systems:  Review of Systems  Cardiovascular: Negative for palpitations.  Musculoskeletal: Positive for back pain.  Neurological: Negative for tremors and weakness.  Psychiatric/Behavioral: Negative for agitation, behavioral problems, confusion, decreased concentration, dysphoric mood, hallucinations, self-injury, sleep disturbance and suicidal ideas. The patient is not nervous/anxious and is not hyperactive.     Medications: I have reviewed the patient's current medications.  Current Outpatient Medications  Medication Sig Dispense Refill  . ALPRAZolam (XANAX) 0.5 MG tablet Take 0.5-1 tablets (0.25-0.5 mg total) by mouth at bedtime as needed for anxiety or sleep (for sleep). 30 tablet 1  . b complex vitamins capsule  Take 1 capsule by mouth daily.    Marland Kitchen FLUoxetine (PROZAC) 20 MG capsule Take 1 capsule (20 mg total) by mouth daily. 90 capsule 3  . methylphenidate (RITALIN) 5 MG tablet Take 1 tablet (5 mg total) by mouth 3 (three) times daily  with meals. 90 tablet 0  . vitamin C (ASCORBIC ACID) 500 MG tablet Take 500 mg by mouth daily.    Derrill Memo ON 11/04/2019] methylphenidate (RITALIN) 5 MG tablet Take 1 tablet (5 mg total) by mouth 3 (three) times daily with meals. 90 tablet 0  . [START ON 11/04/2019] methylphenidate (RITALIN) 5 MG tablet Take 1 tablet (5 mg total) by mouth 3 (three) times daily with meals. 90 tablet 0   No current facility-administered medications for this visit.    Medication Side Effects: None  Allergies:  Allergies  Allergen Reactions  . Biaxin [Clarithromycin]     Upset stomach  . Nsaids Rash    Past Medical History:  Diagnosis Date  . Arthritis     History reviewed. No pertinent family history.  Social History   Socioeconomic History  . Marital status: Divorced    Spouse name: Not on file  . Number of children: Not on file  . Years of education: Not on file  . Highest education level: Not on file  Occupational History  . Not on file  Tobacco Use  . Smoking status: Never Smoker  . Smokeless tobacco: Never Used  Substance and Sexual Activity  . Alcohol use: Never  . Drug use: Never  . Sexual activity: Not on file  Other Topics Concern  . Not on file  Social History Narrative  . Not on file   Social Determinants of Health   Financial Resource Strain:   . Difficulty of Paying Living Expenses:   Food Insecurity:   . Worried About Charity fundraiser in the Last Year:   . Arboriculturist in the Last Year:   Transportation Needs:   . Film/video editor (Medical):   Marland Kitchen Lack of Transportation (Non-Medical):   Physical Activity:   . Days of Exercise per Week:   . Minutes of Exercise per Session:   Stress:   . Feeling of Stress :   Social Connections:   . Frequency of Communication with Friends and Family:   . Frequency of Social Gatherings with Friends and Family:   . Attends Religious Services:   . Active Member of Clubs or Organizations:   . Attends Archivist  Meetings:   Marland Kitchen Marital Status:   Intimate Partner Violence:   . Fear of Current or Ex-Partner:   . Emotionally Abused:   Marland Kitchen Physically Abused:   . Sexually Abused:     Past Medical History, Surgical history, Social history, and Family history were reviewed and updated as appropriate.   Please see review of systems for further details on the patient's review from today.   Objective:   Physical Exam:  There were no vitals taken for this visit.  Physical Exam Constitutional:      General: She is not in acute distress.    Appearance: She is well-developed.  Musculoskeletal:        General: No deformity.  Neurological:     Mental Status: She is alert and oriented to person, place, and time.     Motor: No tremor.     Coordination: Coordination normal.     Gait: Gait normal.  Psychiatric:  Attention and Perception: Attention and perception normal.        Mood and Affect: Mood is not anxious or depressed. Affect is not labile, blunt, angry or inappropriate.        Speech: Speech normal.        Behavior: Behavior normal.        Thought Content: Thought content normal. Thought content does not include homicidal or suicidal ideation. Thought content does not include homicidal or suicidal plan.        Cognition and Memory: Cognition normal.        Judgment: Judgment normal.     Comments: Insight and judgment good. No auditory or visual hallucinations. No delusions.  Less anxiety and depression      Lab Review:     Component Value Date/Time   NA 143 09/05/2017 1535   K 3.7 09/05/2017 1535   CL 106 09/05/2017 1535   CO2 29 09/05/2017 1535   GLUCOSE 99 09/05/2017 1535   BUN 14 09/05/2017 1535   CREATININE 0.69 09/05/2017 1535   CALCIUM 9.9 09/05/2017 1535   GFRNONAA >60 09/05/2017 1535   GFRAA >60 09/05/2017 1535       Component Value Date/Time   WBC 7.1 09/05/2017 1535   RBC 4.71 09/05/2017 1535   HGB 14.5 09/05/2017 1535   HCT 42.6 09/05/2017 1535   PLT 206  09/05/2017 1535   MCV 90.4 09/05/2017 1535   MCH 30.8 09/05/2017 1535   MCHC 34.0 09/05/2017 1535   RDW 11.6 09/05/2017 1535   LYMPHSABS 2.1 01/11/2009 1400   MONOABS 0.7 01/11/2009 1400   EOSABS 0.0 01/11/2009 1400   BASOSABS 0.0 01/11/2009 1400    No results found for: POCLITH, LITHIUM   No results found for: PHENYTOIN, PHENOBARB, VALPROATE, CBMZ   .res Assessment: Plan:    Major depressive disorder, recurrent episode, moderate (HCC) - Plan: FLUoxetine (PROZAC) 20 MG capsule  Attention deficit hyperactivity disorder (ADHD), predominantly inattentive type - Plan: methylphenidate (RITALIN) 5 MG tablet, methylphenidate (RITALIN) 5 MG tablet, methylphenidate (RITALIN) 5 MG tablet  Generalized anxiety disorder - Plan: FLUoxetine (PROZAC) 20 MG capsule  Insomnia due to mental condition - Plan: ALPRAZolam (XANAX) 0.5 MG tablet   Overall doing best she has in awhile with mood, sleep, ADD and anxiety  Disc med sensitivity,   Failed multiple stimulants DT tolerability px.  Probably can't afford liquid stimulant which is the only option left.  Continue Ritalinl   cont fluoxetine to 20 mg daily.  Call if any SE.  Disc the off-label use of N-Acetylcysteine at 600 mg daily to help with mild cognitive problems.  It can be combined with a B-complex vitamin as the B-12 and folate have been shown to sometimes enhance the effect.  We discussed the short-term risks associated with benzodiazepines including sedation and increased fall risk among others.  Discussed long-term side effect risk including dependence, potential withdrawal symptoms, and the potential eventual dose-related risk of dementia.  Disc unusual combo stimulants but is serving good medical purpose.  Disc way to taper off BZ if possible.  FU 6  mos  Lynder Parents, MD, DFAPA   Please see After Visit Summary for patient specific instructions.  Future Appointments  Date Time Provider [place]  10/02/2019 11:00 AM  Clark-Bruning, Anderson Malta, PA-C [facility]    No orders of the defined types were placed in this encounter.     -------------------------------

## 2019-10-02 ENCOUNTER — Ambulatory Visit: Payer: BC Managed Care – PPO | Admitting: Physician Assistant

## 2019-10-30 ENCOUNTER — Other Ambulatory Visit: Payer: Self-pay

## 2019-10-30 ENCOUNTER — Encounter: Payer: Self-pay | Admitting: Physician Assistant

## 2019-10-30 ENCOUNTER — Ambulatory Visit (INDEPENDENT_AMBULATORY_CARE_PROVIDER_SITE_OTHER): Payer: BC Managed Care – PPO | Admitting: Physician Assistant

## 2019-10-30 DIAGNOSIS — L719 Rosacea, unspecified: Secondary | ICD-10-CM | POA: Diagnosis not present

## 2019-10-30 MED ORDER — NONFORMULARY OR COMPOUNDED ITEM: 0 refills | Status: DC

## 2019-10-30 NOTE — Progress Notes (Signed)
   Follow up Visit  Subjective  Alyssa Vaughn is a 69 y.o. female who presents for the following: Rash (present on cheeks for a couple months, has tried metronidazole gel patient says it burns when she uses it). Started with redness and burning on cheeks for a couple months. She does not get any bumps. She was given metrogel by PCP which causes it burn. She also gets some small red bumps up by the eyes. She wonders if it is related to her daughters dog.    Objective  Well appearing patient in no apparent distress; mood and affect are within normal limits.  Face examined. Relevant physical exam findings are noted in the Assessment and Plan.   Objective  Left Malar Cheek, Right Malar Cheek: Mild erythema noted cheeks  Assessment & Plan  Rosacea (2) Left Malar Cheek; Right Malar Cheek  2% metronidazole in 1% hydrocortisone cream bid

## 2019-10-31 ENCOUNTER — Telehealth: Payer: Self-pay | Admitting: Psychiatry

## 2019-10-31 ENCOUNTER — Other Ambulatory Visit: Payer: Self-pay | Admitting: Psychiatry

## 2019-10-31 DIAGNOSIS — F900 Attention-deficit hyperactivity disorder, predominantly inattentive type: Secondary | ICD-10-CM

## 2019-10-31 MED ORDER — METHYLPHENIDATE HCL 5 MG PO TABS: 5.0000 mg | ORAL_TABLET | Freq: Three times a day (TID) | ORAL | 0 refills | Status: DC

## 2019-10-31 NOTE — Telephone Encounter (Signed)
Ritalin prescription sent

## 2019-10-31 NOTE — Telephone Encounter (Signed)
Pt called requesting refill for Ritalin. Last refill was June and next refill has date of 8/2. No July Rx in system. Pt 4 days shy of meds. Can she get early refill?  [facility] on file. Apt 12/7

## 2019-11-06 ENCOUNTER — Ambulatory Visit: Payer: BC Managed Care – PPO | Admitting: Physician Assistant

## 2019-11-13 ENCOUNTER — Telehealth: Payer: Self-pay | Admitting: Physician Assistant

## 2019-11-13 MED ORDER — IVERMECTIN 1 % EX CREA: 1.0000 "application " | TOPICAL_CREAM | Freq: Every day | CUTANEOUS | 2 refills | Status: DC

## 2019-11-13 NOTE — Telephone Encounter (Signed)
Era Skeen KeyRenaldo Fiddler - PA Case ID: [id number] Need help? Call us at [phone] Status Sent to [facility] Ivermectin 1% cream Form [employer] Electronic PA Form [phone] N[place]

## 2019-11-13 NOTE — Telephone Encounter (Addendum)
Patient left message on office voice mail saying that she was having trouble getting to [facility] to get the compounded prescription 2%% Metronidazole in % Hydrocortisone Cream.  Patient wants another "regular formula" prescription sent to [facility] on [address] so she has an easier time picking it up.  (Chart # [id number])

## 2019-11-13 NOTE — Telephone Encounter (Signed)
Sent in Rx for soolantra. Have patient check on coverage. If not covered can pick something else. She has tried metrogel in the past in case it needs a PA

## 2019-11-13 NOTE — Telephone Encounter (Signed)
Patient aware of the message

## 2019-11-14 ENCOUNTER — Telehealth: Payer: Self-pay | Admitting: Physician Assistant

## 2019-11-14 NOTE — Addendum Note (Signed)
Addended by: Sheran Lawless on: 11/14/2019 12:02 PM   Modules accepted: Orders

## 2019-11-14 NOTE — Telephone Encounter (Signed)
Patient left message on office voice mail saying that she went to pick prescription up at [facility] and she thought it was supposed to be Metronidazole Cream, but it was called something else and that prescription was $500.00.  Per patient that is too much.  What does she need to do next?

## 2019-11-14 NOTE — Telephone Encounter (Signed)
Alyssa Vaughn Alyssa Vaughn - PA Case ID: [id number] Need help? Call us at [phone] Outcome Approvedon August 11 Your request has been approved Drug Ivermectin 1% cream Form Charity fundraiser PA Form (2017 NCPDP) Patient left message on office voice mail saying that she went to pick prescription up at [facility] and she thought it was supposed to be Metronidazole Cream, but it was called something else and that prescription was $500.00.  Per patient that is too much.  What does she need to do next?  Patient stated 500$ for rx  Patient stated she don't want the metro gel it burns, [facility] [place] [facility] [place] to get the original medication she wanted and then canceled.

## 2019-12-05 ENCOUNTER — Other Ambulatory Visit: Payer: Self-pay | Admitting: Psychiatry

## 2019-12-05 DIAGNOSIS — F331 Major depressive disorder, recurrent, moderate: Secondary | ICD-10-CM

## 2019-12-05 DIAGNOSIS — F411 Generalized anxiety disorder: Secondary | ICD-10-CM

## 2019-12-05 NOTE — Telephone Encounter (Signed)
Please review

## 2020-01-13 ENCOUNTER — Other Ambulatory Visit: Payer: Self-pay | Admitting: Psychiatry

## 2020-01-13 DIAGNOSIS — F5105 Insomnia due to other mental disorder: Secondary | ICD-10-CM

## 2020-03-04 ENCOUNTER — Other Ambulatory Visit: Payer: Self-pay | Admitting: Psychiatry

## 2020-03-04 DIAGNOSIS — F411 Generalized anxiety disorder: Secondary | ICD-10-CM

## 2020-03-04 DIAGNOSIS — F331 Major depressive disorder, recurrent, moderate: Secondary | ICD-10-CM

## 2020-03-10 ENCOUNTER — Other Ambulatory Visit: Payer: Self-pay

## 2020-03-10 ENCOUNTER — Encounter: Payer: Self-pay | Admitting: Psychiatry

## 2020-03-10 ENCOUNTER — Ambulatory Visit (INDEPENDENT_AMBULATORY_CARE_PROVIDER_SITE_OTHER): Payer: Medicare HMO | Admitting: Psychiatry

## 2020-03-10 DIAGNOSIS — F5105 Insomnia due to other mental disorder: Secondary | ICD-10-CM

## 2020-03-10 DIAGNOSIS — F411 Generalized anxiety disorder: Secondary | ICD-10-CM | POA: Diagnosis not present

## 2020-03-10 DIAGNOSIS — F900 Attention-deficit hyperactivity disorder, predominantly inattentive type: Secondary | ICD-10-CM | POA: Diagnosis not present

## 2020-03-10 DIAGNOSIS — F4329 Adjustment disorder with other symptoms: Secondary | ICD-10-CM

## 2020-03-10 DIAGNOSIS — F331 Major depressive disorder, recurrent, moderate: Secondary | ICD-10-CM | POA: Diagnosis not present

## 2020-03-10 DIAGNOSIS — F4381 Prolonged grief disorder: Secondary | ICD-10-CM

## 2020-03-10 MED ORDER — ALPRAZOLAM 0.5 MG PO TABS: ORAL_TABLET | ORAL | 1 refills | Status: DC

## 2020-03-10 NOTE — Patient Instructions (Signed)
Call Alvester Chou PhD.

## 2020-03-10 NOTE — Progress Notes (Signed)
MARGAURITE SALIDO [id number] Dec 10, 1950 69 y.o.  Subjective:   Patient ID:  Alyssa Vaughn is a 69 y.o. (DOB Jul 12, 1950) female.  Chief Complaint:  Chief Complaint  Patient presents with  . Follow-up  . ADHD  . Anxiety  . Depression    Anxiety Patient reports no confusion, decreased concentration, nervous/anxious behavior, palpitations or suicidal ideas.     BREONA CHERUBIN presents to the office today for follow-up of increased fluoxetine for worsening depression without reason.    Last seen October 2020.  No meds were changed.  Fluoxetine 20 mg was continued.  Not wanting to take clonazepam bc now too sleepy with it.    07/09/2019 appointment the following is noted: Wants to try Quillivent bc easily confused, distracted and unfocused as noted. Good overall.  Fluoxetine helps anxiety and tears. Only taking Xanax 1/2 tablet at night.   Still ADD and head does not feel awake.  Hard to concentrate with groups.  But also doesn't feel quite awake and alert usually.   Feel like my brain won't wake up.  Tried modafinil 50 mg but nervous so much couldn't evaluate effect on ADD.  Has tried lots of ADD meds and didn't tolerate them. Grand kids in best.  [name] murdered but he hasn't been in the picture.  Pleased she's been active.  Not generally self-motivated and pleased she's stayed busy. 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  I just have to let it go.   Can't focus on [place].  She periodically struggles with sadness over the brokenness in the relationship.  Recognizes she craves his attention.  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  Don't need clonazepam at any other time.  Patient denies difficulty with sleep initiation or maintenance. 7-8 hours.  Denies appetite disturbance.  Patient reports that energy and motivation have been good.  Patient denies any suicidal ideation.  No longer  depressed usually and anxious but episodic.  Klonopin good once daily for anxiety.  Xanax for sleep ok. Plan: Start Quillivant 1 ml daily and may increase by 1 ml weekly as tolerated up to about 3-4 ml  09/03/2019 phone call that the Laurence Ferrari was too expensive and wanted a prescription for Ritalin 5 mg which was sent.  09/09/2019 appointment with the following noted: Ritalin helpful.  takiing up to BID and it makes a big difference in focus and productivity. Tolerating it well.   Taking a little alprazolam at night usually about 1/4 of 0.5 mg night.y.  Not taking clonazepam. Asks about taper BZ. Overall feels the best she's been.  After years of divorced she feels more free. Less fear spiritually now. No med changes  03/10/20 appt with following noted: Not taking stimulants. Taking 0.125 mg alprazolam HS.  Makes her sleepy a little.  Not sure if she needs it.  Wonders about stopping .   Overall good and bad.  Up and down mood with a lot of back problems which she's working on.  Sometimes unmanageable. Low days fight with herself, tired and uninterested.  Stress and obsess over [place]. Divorced 2008 and still not over it.  Still no life for herself. Don't get involved with things.  Only looks forward to popcorn and going to bed early.  Everything is drudgery and stay unmotivated. These sx come and go.  It's always there in the background but waxes and wanes.   Working on it.  Past Psychiatric Medication Trials: Modafinil SE, methylphenidate tablets, bupropion, Dextrostat, Adderall, Ritalin, Strattera, Deplin, Vyvanse, Evekeo, Concerta, fluoxetine, Lexapro, sertraline 37.5 side effects, venlafaxine, duloxetine, Pristiq, buspirone, paroxetine, citalopram,  clonazepam, Xanax,    Hydroxyzine Last counseling years ago.  Review of Systems:  Review of Systems  Cardiovascular: Negative for palpitations.  Musculoskeletal: Positive for back pain.  Neurological: Negative for tremors and weakness.   Psychiatric/Behavioral: Positive for dysphoric mood. Negative for agitation, behavioral problems, confusion, decreased concentration, hallucinations, self-injury, sleep disturbance and suicidal ideas. The patient is not nervous/anxious and is not hyperactive.     Medications: I have reviewed the patient's current medications.  Current Outpatient Medications  Medication Sig Dispense Refill  . ALPRAZolam (XANAX) 0.5 MG tablet TAKE 1/2 TO 1 (ONE-HALF TO ONE) TABLET BY MOUTH AT BEDTIME AS NEEDED FOR ANXIETY OR  FOR  SLEEP (Patient taking differently: Take 0.125 mg by mouth at bedtime. ) 30 tablet 1  . b complex vitamins capsule Take 1 capsule by mouth daily.    Marland Kitchen FLUoxetine (PROZAC) 20 MG capsule Take 1 capsule by mouth once daily 90 capsule 0  . Ivermectin (SOOLANTRA) 1 % CREA Apply 1 application topically daily. 30 g 2  . NONFORMULARY OR COMPOUNDED ITEM 2% metronidazole ( crush tab or powder) in 1% hydrocortisone cream qsad 4 oz 1 each 0  . vitamin C (ASCORBIC ACID) 500 MG tablet Take 500 mg by mouth daily.    . methylphenidate (RITALIN) 5 MG tablet Take 1 tablet (5 mg total) by mouth 3 (three) times daily with meals. (Patient not taking: Reported on 03/10/2020) 90 tablet 0  . methylphenidate (RITALIN) 5 MG tablet Take 1 tablet (5 mg total) by mouth 3 (three) times daily with meals. (Patient not taking: Reported on 03/10/2020) 90 tablet 0  . methylphenidate (RITALIN) 5 MG tablet Take 1 tablet (5 mg total) by mouth 3 (three) times daily with meals. (Patient not taking: Reported on 03/10/2020) 90 tablet 0   No current facility-administered medications for this visit.    Medication Side Effects: None  Allergies:  Allergies  Allergen Reactions  . Biaxin [Clarithromycin]     Upset stomach  . Nsaids Rash    Past Medical History:  Diagnosis Date  . Arthritis     History reviewed. No pertinent family history.  Social History   Socioeconomic History  . Marital status: Divorced    Spouse  name: Not on file  . Number of children: Not on file  . Years of education: Not on file  . Highest education level: Not on file  Occupational History  . Not on file  Tobacco Use  . Smoking status: Never Smoker  . Smokeless tobacco: Never Used  Substance and Sexual Activity  . Alcohol use: Never  . Drug use: Never  . Sexual activity: Not on file  Other Topics Concern  . Not on file  Social History Narrative  . Not on file   Social Determinants of Health   Financial Resource Strain:   . Difficulty of Paying Living Expenses: Not on file  Food Insecurity:   . Worried About Charity fundraiser in the Last Year: Not on file  . Ran Out of Food in the Last Year: Not on file  Transportation Needs:   . Lack of Transportation (Medical): Not on file  . Lack of Transportation (Non-Medical): Not on file  Physical Activity:   . Days of Exercise per Week: Not on file  . Minutes of Exercise per Session:  Not on file  Stress:   . Feeling of Stress : Not on file  Social Connections:   . Frequency of Communication with Friends and Family: Not on file  . Frequency of Social Gatherings with Friends and Family: Not on file  . Attends Religious Services: Not on file  . Active Member of Clubs or Organizations: Not on file  . Attends Archivist Meetings: Not on file  . Marital Status: Not on file  Intimate Partner Violence:   . Fear of Current or Ex-Partner: Not on file  . Emotionally Abused: Not on file  . Physically Abused: Not on file  . Sexually Abused: Not on file    Past Medical History, Surgical history, Social history, and Family history were reviewed and updated as appropriate.   Please see review of systems for further details on the patient's review from today.   Objective:   Physical Exam:  There were no vitals taken for this visit.  Physical Exam Constitutional:      General: She is not in acute distress.    Appearance: She is well-developed.  Musculoskeletal:         General: No deformity.  Neurological:     Mental Status: She is alert and oriented to person, place, and time.     Motor: No tremor.     Coordination: Coordination normal.     Gait: Gait normal.  Psychiatric:        Attention and Perception: Attention and perception normal.        Mood and Affect: Mood is depressed. Mood is not anxious. Affect is not labile, blunt, angry or inappropriate.        Speech: Speech normal.        Behavior: Behavior normal.        Thought Content: Thought content normal. Thought content does not include homicidal or suicidal ideation. Thought content does not include homicidal or suicidal plan.        Cognition and Memory: Cognition normal.        Judgment: Judgment normal.     Comments: Insight and judgment good. No auditory or visual hallucinations. No delusions.  more depression connected with unresolved grief      Lab Review:     Component Value Date/Time   NA 143 09/05/2017 1535   K 3.7 09/05/2017 1535   CL 106 09/05/2017 1535   CO2 29 09/05/2017 1535   GLUCOSE 99 09/05/2017 1535   BUN 14 09/05/2017 1535   CREATININE 0.69 09/05/2017 1535   CALCIUM 9.9 09/05/2017 1535   GFRNONAA >60 09/05/2017 1535   GFRAA >60 09/05/2017 1535       Component Value Date/Time   WBC 7.1 09/05/2017 1535   RBC 4.71 09/05/2017 1535   HGB 14.5 09/05/2017 1535   HCT 42.6 09/05/2017 1535   PLT 206 09/05/2017 1535   MCV 90.4 09/05/2017 1535   MCH 30.8 09/05/2017 1535   MCHC 34.0 09/05/2017 1535   RDW 11.6 09/05/2017 1535   LYMPHSABS 2.1 01/11/2009 1400   MONOABS 0.7 01/11/2009 1400   EOSABS 0.0 01/11/2009 1400   BASOSABS 0.0 01/11/2009 1400    No results found for: POCLITH, LITHIUM   No results found for: PHENYTOIN, PHENOBARB, VALPROATE, CBMZ   .res Assessment: Plan:    Major depressive disorder, recurrent episode, moderate (HCC)  Prolonged grief reaction  Attention deficit hyperactivity disorder (ADHD), predominantly inattentive  type  Generalized anxiety disorder   Worse than usual depression with clear connection  with unresolved grief over divorce.  Rec counseling.  Preferred over change in meds again.  Consider Abilify. Disc options. Alvester Chou PhD.   Disc letter writing.  She has journals.   Disc med sensitivity,   Failed multiple stimulants DT tolerability px.  Probably can't afford liquid stimulant which is the only option left.  Continue Ritalinl   cont fluoxetine to 20 mg daily.  Call if any SE.   Disc the off-label use of N-Acetylcysteine at 600 mg daily to help with mild cognitive problems.  It can be combined with a B-complex vitamin as the B-12 and folate have been shown to sometimes enhance the effect.  We discussed the short-term risks associated with benzodiazepines including sedation and increased fall risk among others.  Discussed long-term side effect risk including dependence, potential withdrawal symptoms, and the potential eventual dose-related risk of dementia.  Disc unusual combo stimulants but is serving good medical purpose.  Disc way to taper off BZ if possible.  She might try it.  FU 3-4  mos  Lynder Parents, MD, DFAPA   Please see After Visit Summary for patient specific instructions.  No future appointments.  No orders of the defined types were placed in this encounter.     -------------------------------

## 2020-05-18 DIAGNOSIS — N644 Mastodynia: Secondary | ICD-10-CM | POA: Diagnosis not present

## 2020-05-20 DIAGNOSIS — H0011 Chalazion right upper eyelid: Secondary | ICD-10-CM | POA: Diagnosis not present

## 2020-05-27 ENCOUNTER — Other Ambulatory Visit: Payer: Self-pay | Admitting: Psychiatry

## 2020-05-27 DIAGNOSIS — F331 Major depressive disorder, recurrent, moderate: Secondary | ICD-10-CM

## 2020-05-27 DIAGNOSIS — F411 Generalized anxiety disorder: Secondary | ICD-10-CM

## 2020-05-27 DIAGNOSIS — H9313 Tinnitus, bilateral: Secondary | ICD-10-CM | POA: Diagnosis not present

## 2020-05-27 DIAGNOSIS — R410 Disorientation, unspecified: Secondary | ICD-10-CM | POA: Diagnosis not present

## 2020-05-27 DIAGNOSIS — R42 Dizziness and giddiness: Secondary | ICD-10-CM | POA: Diagnosis not present

## 2020-06-01 ENCOUNTER — Other Ambulatory Visit: Payer: Self-pay | Admitting: Obstetrics & Gynecology

## 2020-06-01 DIAGNOSIS — N644 Mastodynia: Secondary | ICD-10-CM

## 2020-06-02 ENCOUNTER — Encounter: Payer: Self-pay | Admitting: Psychiatry

## 2020-06-02 ENCOUNTER — Other Ambulatory Visit: Payer: Self-pay

## 2020-06-02 ENCOUNTER — Ambulatory Visit (INDEPENDENT_AMBULATORY_CARE_PROVIDER_SITE_OTHER): Payer: Medicare HMO | Admitting: Psychiatry

## 2020-06-02 DIAGNOSIS — F5105 Insomnia due to other mental disorder: Secondary | ICD-10-CM | POA: Diagnosis not present

## 2020-06-02 DIAGNOSIS — F900 Attention-deficit hyperactivity disorder, predominantly inattentive type: Secondary | ICD-10-CM

## 2020-06-02 DIAGNOSIS — F331 Major depressive disorder, recurrent, moderate: Secondary | ICD-10-CM

## 2020-06-02 DIAGNOSIS — F4329 Adjustment disorder with other symptoms: Secondary | ICD-10-CM | POA: Diagnosis not present

## 2020-06-02 DIAGNOSIS — R69 Illness, unspecified: Secondary | ICD-10-CM | POA: Diagnosis not present

## 2020-06-02 DIAGNOSIS — F411 Generalized anxiety disorder: Secondary | ICD-10-CM

## 2020-06-02 DIAGNOSIS — F4381 Prolonged grief disorder: Secondary | ICD-10-CM

## 2020-06-02 MED ORDER — METHYLPHENIDATE HCL 5 MG PO TABS: 5.0000 mg | ORAL_TABLET | Freq: Three times a day (TID) | ORAL | 0 refills | Status: DC

## 2020-06-02 MED ORDER — ARIPIPRAZOLE 2 MG PO TABS: ORAL_TABLET | ORAL | 1 refills | Status: DC

## 2020-06-02 NOTE — Progress Notes (Signed)
MONZERRAT Vaughn [id number] 14-Sep-1950 70 y.o.  Subjective:   Patient ID:  Alyssa Vaughn is a 70 y.o. (DOB January 12, 1951) female.  Chief Complaint:  Chief Complaint  Patient presents with  . Follow-up  . Major depressive disorder, recurrent episode, moderate ([place])  . Depression  . Anxiety  . ADHD    Anxiety Patient reports no confusion, decreased concentration, nervous/anxious behavior, palpitations or suicidal ideas.     Alyssa Vaughn presents to the office today for follow-up of increased fluoxetine for worsening depression without reason.    Last seen October 2020.  No meds were changed.  Fluoxetine 20 mg was continued.  Not wanting to take clonazepam bc now too sleepy with it.    07/09/2019 appointment the following is noted: Wants to try Quillivent bc easily confused, distracted and unfocused as noted. Good overall.  Fluoxetine helps anxiety and tears. Only taking Xanax 1/2 tablet at night.   Still ADD and head does not feel awake.  Hard to concentrate with groups.  But also doesn't feel quite awake and alert usually.   Feel like my brain won't wake up.  Tried modafinil 50 mg but nervous so much couldn't evaluate effect on ADD.  Has tried lots of ADD meds and didn't tolerate them. Grand kids in best.  [name] murdered but he hasn't been in the picture.  Pleased she's been active.  Not generally self-motivated and pleased she's stayed busy. 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  I just have to let it go.   Can't focus on [place].  She periodically struggles with sadness over the brokenness in the relationship.  Recognizes she craves his attention.  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  Don't need clonazepam at any other time.  Patient denies difficulty with sleep initiation or maintenance. 7-8 hours.  Denies appetite disturbance.  Patient reports that energy and motivation have been  good.  Patient denies any suicidal ideation.  No longer depressed usually and anxious but episodic.  Klonopin good once daily for anxiety.  Xanax for sleep ok. Plan: Start Quillivant 1 ml daily and may increase by 1 ml weekly as tolerated up to about 3-4 ml  09/03/2019 phone call that the [facility] was too expensive and wanted a prescription for Ritalin 5 mg which was sent.  09/09/2019 appointment with the following noted: Ritalin helpful.  takiing up to BID and it makes a big difference in focus and productivity. Tolerating it well.   Taking a little alprazolam at night usually about 1/4 of 0.5 mg night.y.  Not taking clonazepam. Asks about taper BZ. Overall feels the best she's been.  After years of divorced she feels more free. Less fear spiritually now. No med changes  03/10/20 appt with following noted: Not taking stimulants. Taking 0.125 mg alprazolam HS.  Makes her sleepy a little.  Not sure if she needs it.  Wonders about stopping .   Overall good and bad.  Up and down mood with a lot of back problems which she's working on.  Sometimes unmanageable. Low days fight with herself, tired and uninterested.  Stress and obsess over [place]. Divorced 2008 and still not over it.  Still no life for herself. Don't get involved with things.  Only looks forward to popcorn and going to bed early.  Everything is drudgery and stay unmotivated. These sx come and go.  It's always there in the background but  waxes and wanes.   Working on it. Plan no changes and continue fluoxetine and Ritalin.  06/02/2020 appointment with following noted: Not taking Ritalin.  Continued fluoxetine.   Tried to address her issues with [name].  Other stuff still there with cry9ing and hard to focus and get through the day.  Weary of trying meds.  Trying to work on herself with self help.  Tried diet and exercise without change in sx.  Fear of going out witiut a reason.  Feels stuck. Last filled Ritalin Augst 2021. Still  depressed.  Struggles to get things done. Recent normal physical.  Past Psychiatric Medication Trials: Modafinil SE, methylphenidate tablets, bupropion, Dextrostat, Adderall, Ritalin, Strattera, Deplin, Vyvanse, Evekeo, Concerta, fluoxetine, Lexapro, sertraline 37.5 side effects, venlafaxine, duloxetine, Pristiq, buspirone, paroxetine, citalopram,  clonazepam, Xanax,    Hydroxyzine Last counseling years ago.  Review of Systems:  Review of Systems  Cardiovascular: Negative for palpitations.  Musculoskeletal: Positive for back pain.  Neurological: Negative for tremors and weakness.  Psychiatric/Behavioral: Positive for dysphoric mood. Negative for agitation, behavioral problems, confusion, decreased concentration, hallucinations, self-injury, sleep disturbance and suicidal ideas. The patient is not nervous/anxious and is not hyperactive.     Medications: I have reviewed the patient's current medications.  Current Outpatient Medications  Medication Sig Dispense Refill  . b complex vitamins capsule Take 1 capsule by mouth daily.    Marland Kitchen FLUoxetine (PROZAC) 20 MG capsule Take 1 capsule by mouth once daily 90 capsule 0  . NONFORMULARY OR COMPOUNDED ITEM 2% metronidazole ( crush tab or powder) in 1% hydrocortisone cream qsad 4 oz 1 each 0  . vitamin C (ASCORBIC ACID) 500 MG tablet Take 500 mg by mouth daily.    Marland Kitchen ALPRAZolam (XANAX) 0.5 MG tablet TAKE 1/2 TO 1 (ONE-HALF TO ONE) TABLET BY MOUTH AT BEDTIME AS NEEDED FOR ANXIETY OR  FOR  SLEEP (Patient taking differently: TAKE 1/2 TO 1 (ONE-HALF TO ONE) TABLET BY MOUTH AT BEDTIME AS NEEDED FOR ANXIETY) 30 tablet 1  . ARIPiprazole (ABILIFY) 2 MG tablet 1/2 tablet daily for 2 weeks, then 1 daily 30 tablet 1  . Ivermectin (SOOLANTRA) 1 % CREA Apply 1 application topically daily. (Patient not taking: Reported on 06/02/2020) 30 g 2  . methylphenidate (RITALIN) 5 MG tablet Take 1 tablet (5 mg total) by mouth 3 (three) times daily with meals. (Patient not  taking: No sig reported) 90 tablet 0  . methylphenidate (RITALIN) 5 MG tablet Take 1 tablet (5 mg total) by mouth 3 (three) times daily with meals. (Patient not taking: No sig reported) 90 tablet 0  . methylphenidate (RITALIN) 5 MG tablet Take 1 tablet (5 mg total) by mouth 3 (three) times daily with meals. 90 tablet 0   No current facility-administered medications for this visit.    Medication Side Effects: None  Allergies:  Allergies  Allergen Reactions  . Biaxin [Clarithromycin]     Upset stomach  . Nsaids Rash    Past Medical History:  Diagnosis Date  . Arthritis     History reviewed. No pertinent family history.  Social History   Socioeconomic History  . Marital status: Divorced    Spouse name: Not on file  . Number of children: Not on file  . Years of education: Not on file  . Highest education level: Not on file  Occupational History  . Not on file  Tobacco Use  . Smoking status: Never Smoker  . Smokeless tobacco: Never Used  Substance and Sexual Activity  .  Alcohol use: Never  . Drug use: Never  . Sexual activity: Not on file  Other Topics Concern  . Not on file  Social History Narrative  . Not on file   Social Determinants of Health   Financial Resource Strain: Not on file  Food Insecurity: Not on file  Transportation Needs: Not on file  Physical Activity: Not on file  Stress: Not on file  Social Connections: Not on file  Intimate Partner Violence: Not on file    Past Medical History, Surgical history, Social history, and Family history were reviewed and updated as appropriate.   Please see review of systems for further details on the patient's review from today.   Objective:   Physical Exam:  There were no vitals taken for this visit.  Physical Exam Constitutional:      General: She is not in acute distress.    Appearance: She is well-developed.  Musculoskeletal:        General: No deformity.  Neurological:     Mental Status: She is  alert and oriented to person, place, and time.     Motor: No tremor.     Coordination: Coordination normal.     Gait: Gait normal.  Psychiatric:        Attention and Perception: Perception normal. She is inattentive.        Mood and Affect: Mood is depressed. Mood is not anxious. Affect is not labile, blunt, angry or inappropriate.        Speech: Speech normal.        Behavior: Behavior normal.        Thought Content: Thought content normal. Thought content does not include homicidal or suicidal ideation. Thought content does not include homicidal or suicidal plan.        Cognition and Memory: Cognition normal.        Judgment: Judgment normal.     Comments: Insight and judgment good. No auditory or visual hallucinations. No delusions.  more depression ongoing      Lab Review:     Component Value Date/Time   NA 143 09/05/2017 1535   K 3.7 09/05/2017 1535   CL 106 09/05/2017 1535   CO2 29 09/05/2017 1535   GLUCOSE 99 09/05/2017 1535   BUN 14 09/05/2017 1535   CREATININE 0.69 09/05/2017 1535   CALCIUM 9.9 09/05/2017 1535   GFRNONAA >60 09/05/2017 1535   GFRAA >60 09/05/2017 1535       Component Value Date/Time   WBC 7.1 09/05/2017 1535   RBC 4.71 09/05/2017 1535   HGB 14.5 09/05/2017 1535   HCT 42.6 09/05/2017 1535   PLT 206 09/05/2017 1535   MCV 90.4 09/05/2017 1535   MCH 30.8 09/05/2017 1535   MCHC 34.0 09/05/2017 1535   RDW 11.6 09/05/2017 1535   LYMPHSABS 2.1 01/11/2009 1400   MONOABS 0.7 01/11/2009 1400   EOSABS 0.0 01/11/2009 1400   BASOSABS 0.0 01/11/2009 1400    No results found for: POCLITH, LITHIUM   No results found for: PHENYTOIN, PHENOBARB, VALPROATE, CBMZ   .res Assessment: Plan:    Major depressive disorder, recurrent episode, moderate (HCC) - Plan: ARIPiprazole (ABILIFY) 2 MG tablet  Generalized anxiety disorder - Plan: ARIPiprazole (ABILIFY) 2 MG tablet  Attention deficit hyperactivity disorder (ADHD), predominantly inattentive type - Plan:  methylphenidate (RITALIN) 5 MG tablet  Insomnia due to mental condition  Prolonged grief reaction   Worse than usual depression with clear connection with unresolved grief over divorce.  Rec counseling.  Preferred over change in meds again.  Consider Abilify. Disc options. Alvester Chou PhD.    Disc letter writing.  She has journals.  Potentiate Abilify 2 mg tablet 1/2 daily for 2 weeks, then 1 tablet Discussed potential metabolic side effects associated with atypical antipsychotics, as well as potential risk for movement side effects. Advised pt to contact office if movement side effects occur.  At this ultralow dose side effect risk would be minimal.  Generally she is med sensitive so would expect to see some benefit at low dosage.  Consider alternative Trintellix but it might not be affordable.  Disc med sensitivity, discussed her concerns in general fearfulness around medications.  Failed multiple stimulants DT tolerability px.  Probably can't afford liquid stimulant which is the only option left.  Continue Ritalin as needed  cont fluoxetine to 20 mg daily.  Call if any SE.   Disc the off-label use of N-Acetylcysteine at 600 mg daily to help with mild cognitive problems.  It can be combined with a B-complex vitamin as the B-12 and folate have been shown to sometimes enhance the effect.  We discussed the short-term risks associated with benzodiazepines including sedation and increased fall risk among others.  Discussed long-term side effect risk including dependence, potential withdrawal symptoms, and the potential eventual dose-related risk of dementia.  Disc unusual combo stimulants but is serving good medical purpose.  Disc way to taper off BZ if possible.  She might try it.  FU 1-2 mos  Lynder Parents, MD, DFAPA   Please see After Visit Summary for patient specific instructions.  Future Appointments  Date Time Provider [place]  07/13/2020  2:00 PM [facility] [place] [facility]  [facility]  07/13/2020  2:10 PM [facility] [place] [facility]    No orders of the defined types were placed in this encounter.     -------------------------------

## 2020-06-09 DIAGNOSIS — H43811 Vitreous degeneration, right eye: Secondary | ICD-10-CM | POA: Diagnosis not present

## 2020-06-09 DIAGNOSIS — Z961 Presence of intraocular lens: Secondary | ICD-10-CM | POA: Diagnosis not present

## 2020-06-17 ENCOUNTER — Other Ambulatory Visit: Payer: Self-pay | Admitting: Psychiatry

## 2020-06-17 DIAGNOSIS — F5105 Insomnia due to other mental disorder: Secondary | ICD-10-CM

## 2020-06-18 NOTE — Telephone Encounter (Signed)
Controlled substance 

## 2020-06-22 DIAGNOSIS — L814 Other melanin hyperpigmentation: Secondary | ICD-10-CM | POA: Diagnosis not present

## 2020-06-22 DIAGNOSIS — L718 Other rosacea: Secondary | ICD-10-CM | POA: Diagnosis not present

## 2020-06-22 DIAGNOSIS — L818 Other specified disorders of pigmentation: Secondary | ICD-10-CM | POA: Diagnosis not present

## 2020-06-29 ENCOUNTER — Other Ambulatory Visit: Payer: Self-pay

## 2020-06-29 ENCOUNTER — Telehealth: Payer: Self-pay | Admitting: Psychiatry

## 2020-06-29 DIAGNOSIS — F900 Attention-deficit hyperactivity disorder, predominantly inattentive type: Secondary | ICD-10-CM

## 2020-06-29 MED ORDER — METHYLPHENIDATE HCL 5 MG PO TABS
5.0000 mg | ORAL_TABLET | Freq: Three times a day (TID) | ORAL | 0 refills | Status: DC
Start: 2020-06-29 — End: 2020-07-21

## 2020-06-29 NOTE — Telephone Encounter (Signed)
Pended for Dr. Clovis Pu to review and send

## 2020-06-29 NOTE — Telephone Encounter (Signed)
Pt would like a refill on Methylphenidate. Please send to [facility] on [address].

## 2020-07-09 DIAGNOSIS — H938X3 Other specified disorders of ear, bilateral: Secondary | ICD-10-CM | POA: Diagnosis not present

## 2020-07-09 DIAGNOSIS — H903 Sensorineural hearing loss, bilateral: Secondary | ICD-10-CM | POA: Diagnosis not present

## 2020-07-09 DIAGNOSIS — H9313 Tinnitus, bilateral: Secondary | ICD-10-CM | POA: Diagnosis not present

## 2020-07-13 ENCOUNTER — Other Ambulatory Visit: Payer: BC Managed Care – PPO

## 2020-07-21 ENCOUNTER — Encounter: Payer: Self-pay | Admitting: Psychiatry

## 2020-07-21 ENCOUNTER — Other Ambulatory Visit: Payer: Self-pay

## 2020-07-21 ENCOUNTER — Ambulatory Visit (INDEPENDENT_AMBULATORY_CARE_PROVIDER_SITE_OTHER): Payer: Medicare HMO | Admitting: Psychiatry

## 2020-07-21 VITALS — BP 145/91 | HR 67

## 2020-07-21 DIAGNOSIS — F411 Generalized anxiety disorder: Secondary | ICD-10-CM

## 2020-07-21 DIAGNOSIS — F5105 Insomnia due to other mental disorder: Secondary | ICD-10-CM

## 2020-07-21 DIAGNOSIS — F900 Attention-deficit hyperactivity disorder, predominantly inattentive type: Secondary | ICD-10-CM

## 2020-07-21 DIAGNOSIS — F331 Major depressive disorder, recurrent, moderate: Secondary | ICD-10-CM | POA: Diagnosis not present

## 2020-07-21 DIAGNOSIS — R69 Illness, unspecified: Secondary | ICD-10-CM | POA: Diagnosis not present

## 2020-07-21 MED ORDER — ALPRAZOLAM 0.5 MG PO TABS: ORAL_TABLET | ORAL | 2 refills | Status: DC

## 2020-07-21 MED ORDER — ARIPIPRAZOLE 2 MG PO TABS: 2.0000 mg | ORAL_TABLET | Freq: Every day | ORAL | 1 refills | Status: DC

## 2020-07-21 MED ORDER — METHYLPHENIDATE HCL ER (OSM) 36 MG PO TBCR: 36.0000 mg | EXTENDED_RELEASE_TABLET | Freq: Every day | ORAL | 0 refills | Status: DC

## 2020-07-21 MED ORDER — FLUOXETINE HCL 20 MG PO CAPS: 20.0000 mg | ORAL_CAPSULE | Freq: Every day | ORAL | 1 refills | Status: DC

## 2020-07-21 NOTE — Progress Notes (Signed)
Alyssa Vaughn [id number] [birth year] 70 y.o.  Subjective:   Patient ID:  Alyssa Vaughn is a 70 y.o. (DOB April 29, 1950) female.  Chief Complaint:  Chief Complaint  Patient presents with  . Follow-up  . Major depressive disorder, recurrent episode, moderate ([place])  . ADHD    Anxiety Patient reports no confusion, decreased concentration, nervous/anxious behavior, palpitations or suicidal ideas.     Alyssa Vaughn presents to the office today for follow-up of increased fluoxetine for worsening depression without reason.    seen October 2020.  No meds were changed.  Fluoxetine 20 mg was continued.  Not wanting to take clonazepam bc now too sleepy with it.    07/09/2019 appointment the following is noted: Wants to try Quillivent bc easily confused, distracted and unfocused as noted. Good overall.  Fluoxetine helps anxiety and tears. Only taking Xanax 1/2 tablet at night.   Still ADD and head does not feel awake.  Hard to concentrate with groups.  But also doesn't feel quite awake and alert usually.   Feel like my brain won't wake up.  Tried modafinil 50 mg but nervous so much couldn't evaluate effect on ADD.  Has tried lots of ADD meds and didn't tolerate them. Grand kids in best.  [name] murdered but he hasn't been in the picture.  Pleased she's been active.  Not generally self-motivated and pleased she's stayed busy. 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  I just have to let it go.   Can't focus on [place].  She periodically struggles with sadness over the brokenness in the relationship.  Recognizes she craves his attention.  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  Don't need clonazepam at any other time.  Patient denies difficulty with sleep initiation or maintenance. 7-8 hours.  Denies appetite disturbance.  Patient reports that energy and motivation have been good.  Patient denies any  suicidal ideation.  No longer depressed usually and anxious but episodic.  Klonopin good once daily for anxiety.  Xanax for sleep ok. Plan: Start Quillivant 1 ml daily and may increase by 1 ml weekly as tolerated up to about 3-4 ml  09/03/2019 phone call that the Laurence Ferrari was too expensive and wanted a prescription for Ritalin 5 mg which was sent.  09/09/2019 appointment with the following noted: Ritalin helpful.  takiing up to BID and it makes a big difference in focus and productivity. Tolerating it well.   Taking a little alprazolam at night usually about 1/4 of 0.5 mg night.y.  Not taking clonazepam. Asks about taper BZ. Overall feels the best she's been.  After years of divorced she feels more free. Less fear spiritually now. No med changes  03/10/20 appt with following noted: Not taking stimulants. Taking 0.125 mg alprazolam HS.  Makes her sleepy a little.  Not sure if she needs it.  Wonders about stopping .   Overall good and bad.  Up and down mood with a lot of back problems which she's working on.  Sometimes unmanageable. Low days fight with herself, tired and uninterested.  Stress and obsess over [place]. Divorced 2008 and still not over it.  Still no life for herself. Don't get involved with things.  Only looks forward to popcorn and going to bed early.  Everything is drudgery and stay unmotivated. These sx come and go.  It's always there in the background but waxes and wanes.   Working on  it. Plan no changes and continue fluoxetine and Ritalin.  06/02/2020 appointment with following noted: Not taking Ritalin.  Continued fluoxetine.   Tried to address her issues with [name].  Other stuff still there with cry9ing and hard to focus and get through the day.  Weary of trying meds.  Trying to work on herself with self help.  Tried diet and exercise without change in sx.  Fear of going out witiut a reason.  Feels stuck. Last filled Ritalin Augst 2021. Still depressed.  Struggles to get things  done. Recent normal physical.  Plan:  Potentiate Abilify 2 mg tablet 1/2 daily for 2 weeks, then 1 tablet for sig depression  07/21/2020 appt noted: Good with aripiprazole it helped a lot noticeably, smooth Taking Abilify 2 mg daily. Got rid of heavy feeling of depresssion.  No crying.  Easier to do things and go places No SE with meds. Restarted Ritalin 5 mg TID too low.  Wants ER. Needs equivalent of 10 TID.  Helps the confusion of the day and improves focus and completion.  Good combo with Abilify.  Past Psychiatric Medication Trials: Modafinil SE, methylphenidate tablets,  bupropion, Dextrostat, Adderall, Ritalin, Strattera, Deplin, Vyvanse, Evekeo, Concerta, fluoxetine, Lexapro, sertraline 37.5 side effects, venlafaxine, duloxetine, Pristiq, buspirone, paroxetine, citalopram,  Abilify 2 marked benefit clonazepam, Xanax,    Hydroxyzine Last counseling years ago.  Review of Systems:  Review of Systems  Cardiovascular: Negative for palpitations.  Musculoskeletal: Positive for back pain.  Neurological: Negative for tremors and weakness.  Psychiatric/Behavioral: Negative for agitation, behavioral problems, confusion, decreased concentration, dysphoric mood, hallucinations, self-injury, sleep disturbance and suicidal ideas. The patient is not nervous/anxious and is not hyperactive.     Medications: I have reviewed the patient's current medications.  Current Outpatient Medications  Medication Sig Dispense Refill  . b complex vitamins capsule Take 1 capsule by mouth daily.    Marland Kitchen CALCIUM PO Take by mouth.    . Ivermectin (SOOLANTRA) 1 % CREA Apply 1 application topically daily. 30 g 2  . methylphenidate (CONCERTA) 36 MG PO CR tablet Take 1 tablet (36 mg total) by mouth daily. 30 tablet 0  . Omega-3 1000 MG CAPS Take by mouth.    . vitamin C (ASCORBIC ACID) 500 MG tablet Take 500 mg by mouth daily.    Marland Kitchen ALPRAZolam (XANAX) 0.5 MG tablet TAKE 1/2 TO 1 (ONE-HALF TO ONE) TABLET BY MOUTH AT  BEDTIME AS NEEDED FOR ANXIETY SLEEP 30 tablet 2  . ARIPiprazole (ABILIFY) 2 MG tablet Take 1 tablet (2 mg total) by mouth daily. 90 tablet 1  . FLUoxetine (PROZAC) 20 MG capsule Take 1 capsule (20 mg total) by mouth daily. 90 capsule 1  . NONFORMULARY OR COMPOUNDED ITEM 2% metronidazole ( crush tab or powder) in 1% hydrocortisone cream qsad 4 oz (Patient not taking: Reported on 07/21/2020) 1 each 0   No current facility-administered medications for this visit.    Medication Side Effects: None  Allergies:  Allergies  Allergen Reactions  . Biaxin [Clarithromycin]     Upset stomach  . Nsaids Rash    Past Medical History:  Diagnosis Date  . Arthritis     History reviewed. No pertinent family history.  Social History   Socioeconomic History  . Marital status: Divorced    Spouse name: Not on file  . Number of children: Not on file  . Years of education: Not on file  . Highest education level: Not on file  Occupational History  . Not  on file  Tobacco Use  . Smoking status: Never Smoker  . Smokeless tobacco: Never Used  Substance and Sexual Activity  . Alcohol use: Never  . Drug use: Never  . Sexual activity: Not on file  Other Topics Concern  . Not on file  Social History Narrative  . Not on file   Social Determinants of Health   Financial Resource Strain: Not on file  Food Insecurity: Not on file  Transportation Needs: Not on file  Physical Activity: Not on file  Stress: Not on file  Social Connections: Not on file  Intimate Partner Violence: Not on file    Past Medical History, Surgical history, Social history, and Family history were reviewed and updated as appropriate.   Please see review of systems for further details on the patient's review from today.   Objective:   Physical Exam:  BP (!) 145/91   Pulse 67   Physical Exam Constitutional:      General: She is not in acute distress.    Appearance: She is well-developed.  Musculoskeletal:         General: No deformity.  Neurological:     Mental Status: She is alert and oriented to person, place, and time.     Motor: No tremor.     Coordination: Coordination normal.     Gait: Gait normal.  Psychiatric:        Attention and Perception: Perception normal. She is attentive.        Mood and Affect: Mood is not anxious or depressed. Affect is not labile, blunt, angry or inappropriate.        Speech: Speech normal.        Behavior: Behavior normal.        Thought Content: Thought content normal. Thought content does not include homicidal or suicidal ideation. Thought content does not include homicidal or suicidal plan.        Cognition and Memory: Cognition normal.        Judgment: Judgment normal.     Comments: Insight and judgment good. No auditory or visual hallucinations. No delusions.        Lab Review:     Component Value Date/Time   NA 143 09/05/2017 1535   K 3.7 09/05/2017 1535   CL 106 09/05/2017 1535   CO2 29 09/05/2017 1535   GLUCOSE 99 09/05/2017 1535   BUN 14 09/05/2017 1535   CREATININE 0.69 09/05/2017 1535   CALCIUM 9.9 09/05/2017 1535   GFRNONAA >60 09/05/2017 1535   GFRAA >60 09/05/2017 1535       Component Value Date/Time   WBC 7.1 09/05/2017 1535   RBC 4.71 09/05/2017 1535   HGB 14.5 09/05/2017 1535   HCT 42.6 09/05/2017 1535   PLT 206 09/05/2017 1535   MCV 90.4 09/05/2017 1535   MCH 30.8 09/05/2017 1535   MCHC 34.0 09/05/2017 1535   RDW 11.6 09/05/2017 1535   LYMPHSABS 2.1 01/11/2009 1400   MONOABS 0.7 01/11/2009 1400   EOSABS 0.0 01/11/2009 1400   BASOSABS 0.0 01/11/2009 1400    No results found for: POCLITH, LITHIUM   No results found for: PHENYTOIN, PHENOBARB, VALPROATE, CBMZ   .res Assessment: Plan:    Major depressive disorder, recurrent episode, moderate ([facility]) - Plan: ARIPiprazole (ABILIFY) 2 MG tablet, FLUoxetine (PROZAC) 20 MG capsule  Attention deficit hyperactivity disorder (ADHD), predominantly inattentive type - Plan:  methylphenidate (CONCERTA) 36 MG PO CR tablet  Generalized anxiety disorder - Plan: ARIPiprazole (ABILIFY) 2 MG  tablet, FLUoxetine (PROZAC) 20 MG capsule  Insomnia due to mental condition - Plan: ALPRAZolam (XANAX) 0.5 MG tablet   Worse than usual depression with clear connection with unresolved grief over divorce.  Rec counseling.  Preferred over change in meds again.  Consider Abilify. Disc options. Alvester Chou PhD.    Disc letter writing.  She has journals.  Potentiate Abilify 2 mg tablet daily worked with resolution of depression Discussed potential metabolic side effects associated with atypical antipsychotics, as well as potential risk for movement side effects. Advised pt to contact office if movement side effects occur.  At this ultralow dose side effect risk would be minimal.  Generally she is med sensitive so would expect to see some benefit at low dosage.  Consider alternative Trintellix but it might not be affordable.  Disc med sensitivity, discussed her concerns in general fearfulness around medications.  Failed multiple stimulants DT tolerability px.  Probably can't afford liquid stimulant which is the only option left.  Continue Ritalin as needed  cont fluoxetine to 20 mg daily.  Call if any SE.   Discussed potential benefits, risks, and side effects of stimulants with patient to include increased heart rate, palpitations, insomnia, increased anxiety, increased irritability, or decreased appetite.  Instructed patient to contact office if experiencing any significant tolerability issues. Switch to [place] Concerta 36 mg AM.  Call if too much.  We discussed the short-term risks associated with benzodiazepines including sedation and increased fall risk among others.  Discussed long-term side effect risk including dependence, potential withdrawal symptoms, and the potential eventual dose-related risk of dementia.  Disc unusual combo stimulants but is serving good medical purpose.  Disc  way to taper off BZ if possible.  She might try it.  FU 3 mos  Lynder Parents, MD, DFAPA   Please see After Visit Summary for patient specific instructions.  No future appointments.  No orders of the defined types were placed in this encounter.     -------------------------------

## 2020-07-24 ENCOUNTER — Telehealth: Payer: Self-pay | Admitting: Psychiatry

## 2020-07-24 NOTE — Telephone Encounter (Signed)
Please review

## 2020-07-24 NOTE — Telephone Encounter (Signed)
Her PA is still pending at this time. 

## 2020-07-24 NOTE — Telephone Encounter (Signed)
Pt called asking about the status of the PA that was done for Concerta 36mg . Pt said that she has been waiting since Monday. Please call with update.

## 2020-07-29 NOTE — Telephone Encounter (Signed)
Prior Approval received for METHYLPHENIDATE ER 36 MG effective 07/24/2020-07/25/2023 with [facility].

## 2020-08-03 ENCOUNTER — Telehealth: Payer: Self-pay | Admitting: Psychiatry

## 2020-08-03 NOTE — Telephone Encounter (Signed)
Pt left a message stating that the concerta 36 mg is too strong and she would like to go to a lower dose. Please give her a call at [phone]

## 2020-08-03 NOTE — Telephone Encounter (Signed)
Pt stated she has been very jittery and it has not subsided.You told her to call if she had any symptoms

## 2020-08-03 NOTE — Telephone Encounter (Signed)
Okay we will need to reduce the Concerta to 27 mg daily because the 36 mg dosage is too strong.  I will have to wait to send in the prescription when the e-prescribing system is working again.  This may not be fixed today but should be fixed by tomorrow.

## 2020-08-03 NOTE — Telephone Encounter (Signed)
Pt informed

## 2020-08-04 ENCOUNTER — Other Ambulatory Visit: Payer: Self-pay | Admitting: Psychiatry

## 2020-08-04 DIAGNOSIS — F900 Attention-deficit hyperactivity disorder, predominantly inattentive type: Secondary | ICD-10-CM

## 2020-08-04 MED ORDER — METHYLPHENIDATE HCL ER (OSM) 27 MG PO TBCR: 27.0000 mg | EXTENDED_RELEASE_TABLET | Freq: Every day | ORAL | 0 refills | Status: DC

## 2020-08-04 NOTE — Telephone Encounter (Signed)
Pt informed

## 2020-08-04 NOTE — Telephone Encounter (Signed)
Please let her know I was able to send in the prescription for Concerta 27 mg daily which is a reduction from her current 36 mg.  Sent as she requested.

## 2020-08-17 ENCOUNTER — Telehealth: Payer: Self-pay

## 2020-08-17 NOTE — Telephone Encounter (Signed)
Prior Approval received for METHYLPHENIDATE CR 27 MG #30 with [facility] effective 08/10/2020-08/11/2023. [place]

## 2020-08-24 ENCOUNTER — Telehealth: Payer: Self-pay | Admitting: Psychiatry

## 2020-08-24 ENCOUNTER — Other Ambulatory Visit: Payer: Self-pay | Admitting: Psychiatry

## 2020-08-24 MED ORDER — METHYLPHENIDATE HCL 5 MG PO TABS: 5.0000 mg | ORAL_TABLET | Freq: Three times a day (TID) | ORAL | 0 refills | Status: DC

## 2020-08-24 NOTE — Telephone Encounter (Signed)
Pt asking for change to previous medication.

## 2020-08-24 NOTE — Telephone Encounter (Signed)
Spoke with patient to make sure she did not want to give the lower dose of Concerta 18 mg an option.  She decided against that and wants to continue with the previous dose of Ritalin 5 mg 3 times daily.  So that prescription will be sent in.  She agreed with the plan

## 2020-08-24 NOTE — Telephone Encounter (Signed)
Next visit is 09/01/20. Alyssa Vaughn called and said that the Concerta XR 27 mg isn't agreeing with her. She feels icky and jittery when she takes this mg. She wants to go back to the original one she has taken, 5 mg. 3/day. Her number is [phone].

## 2020-09-01 ENCOUNTER — Other Ambulatory Visit: Payer: Self-pay

## 2020-09-01 ENCOUNTER — Encounter: Payer: Self-pay | Admitting: Psychiatry

## 2020-09-01 ENCOUNTER — Ambulatory Visit (INDEPENDENT_AMBULATORY_CARE_PROVIDER_SITE_OTHER): Payer: Medicare HMO | Admitting: Psychiatry

## 2020-09-01 VITALS — BP 111/75 | HR 74

## 2020-09-01 DIAGNOSIS — F900 Attention-deficit hyperactivity disorder, predominantly inattentive type: Secondary | ICD-10-CM | POA: Diagnosis not present

## 2020-09-01 DIAGNOSIS — F3342 Major depressive disorder, recurrent, in full remission: Secondary | ICD-10-CM

## 2020-09-01 DIAGNOSIS — R69 Illness, unspecified: Secondary | ICD-10-CM | POA: Diagnosis not present

## 2020-09-01 DIAGNOSIS — F5105 Insomnia due to other mental disorder: Secondary | ICD-10-CM | POA: Diagnosis not present

## 2020-09-01 DIAGNOSIS — F411 Generalized anxiety disorder: Secondary | ICD-10-CM | POA: Diagnosis not present

## 2020-09-01 MED ORDER — METHYLPHENIDATE HCL ER 18 MG PO TB24: 18.0000 mg | ORAL_TABLET | Freq: Every day | ORAL | 0 refills | Status: DC

## 2020-09-01 NOTE — Progress Notes (Signed)
Alyssa Vaughn [id number] [birth year] 70 y.o.  Subjective:   Patient ID:  Alyssa Vaughn is a 70 y.o. (DOB April 29, 1950) female.  Chief Complaint:  Chief Complaint  Patient presents with  . Follow-up  . Major depressive disorder, recurrent episode, moderate ([place])  . ADHD    Anxiety Patient reports no confusion, decreased concentration, nervous/anxious behavior, palpitations or suicidal ideas.     Alyssa Vaughn presents to the office today for follow-up of increased fluoxetine for worsening depression without reason.    seen October 2020.  No meds were changed.  Fluoxetine 20 mg was continued.  Not wanting to take clonazepam bc now too sleepy with it.    07/09/2019 appointment the following is noted: Wants to try Quillivent bc easily confused, distracted and unfocused as noted. Good overall.  Fluoxetine helps anxiety and tears. Only taking Xanax 1/2 tablet at night.   Still ADD and head does not feel awake.  Hard to concentrate with groups.  But also doesn't feel quite awake and alert usually.   Feel like my brain won't wake up.  Tried modafinil 50 mg but nervous so much couldn't evaluate effect on ADD.  Has tried lots of ADD meds and didn't tolerate them. Grand kids in best.  [name] murdered but he hasn't been in the picture.  Pleased she's been active.  Not generally self-motivated and pleased she's stayed busy. 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  I just have to let it go.   Can't focus on [place].  She periodically struggles with sadness over the brokenness in the relationship.  Recognizes she craves his attention.  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  Don't need clonazepam at any other time.  Patient denies difficulty with sleep initiation or maintenance. 7-8 hours.  Denies appetite disturbance.  Patient reports that energy and motivation have been good.  Patient denies any  suicidal ideation.  No longer depressed usually and anxious but episodic.  Klonopin good once daily for anxiety.  Xanax for sleep ok. Plan: Start Quillivant 1 ml daily and may increase by 1 ml weekly as tolerated up to about 3-4 ml  09/03/2019 phone call that the Laurence Ferrari was too expensive and wanted a prescription for Ritalin 5 mg which was sent.  09/09/2019 appointment with the following noted: Ritalin helpful.  takiing up to BID and it makes a big difference in focus and productivity. Tolerating it well.   Taking a little alprazolam at night usually about 1/4 of 0.5 mg night.y.  Not taking clonazepam. Asks about taper BZ. Overall feels the best she's been.  After years of divorced she feels more free. Less fear spiritually now. No med changes  03/10/20 appt with following noted: Not taking stimulants. Taking 0.125 mg alprazolam HS.  Makes her sleepy a little.  Not sure if she needs it.  Wonders about stopping .   Overall good and bad.  Up and down mood with a lot of back problems which she's working on.  Sometimes unmanageable. Low days fight with herself, tired and uninterested.  Stress and obsess over [place]. Divorced 2008 and still not over it.  Still no life for herself. Don't get involved with things.  Only looks forward to popcorn and going to bed early.  Everything is drudgery and stay unmotivated. These sx come and go.  It's always there in the background but waxes and wanes.   Working on  it. Plan no changes and continue fluoxetine and Ritalin.  06/02/2020 appointment with following noted: Not taking Ritalin.  Continued fluoxetine.   Tried to address her issues with [name].  Other stuff still there with cry9ing and hard to focus and get through the day.  Weary of trying meds.  Trying to work on herself with self help.  Tried diet and exercise without change in sx.  Fear of going out witiut a reason.  Feels stuck. Last filled Ritalin Augst 2021. Still depressed.  Struggles to get things  done. Recent normal physical.  Plan:  Potentiate Abilify 2 mg tablet 1/2 daily for 2 weeks, then 1 tablet for sig depression  07/21/2020 appt noted: Good with aripiprazole it helped a lot noticeably, smooth Taking Abilify 2 mg daily. Got rid of heavy feeling of depresssion.  No crying.  Easier to do things and go places No SE with meds. Restarted Ritalin 5 mg TID too low.  Wants ER. Needs equivalent of 10 TID.  Helps the confusion of the day and improves focus and completion.  Good combo with Abilify. Plan:  Switch to LA Concerta 36 mg AM.  Call if too much.  09/01/2020 appointment noted: She called a couple times since being here and found both the 36 mg size and the 27 mg size of Concerta to be "too much" including symptoms of tension and anxiety and wanted to switch back to the Ritalin 5 mg 3 times daily.  She was offered the option of going to the lowest dose of Concerta 18 mg but declined. Ritalin 5 TID not very effective.  Would like to try lower dose of Concerta. Mood still very good and unusual for me.  Benefit Abilify.  Working on herself.   Sleep is good.     Past Psychiatric Medication Trials: Modafinil SE, methylphenidate tablets,  , Dextrostat, Adderall, Ritalin, Strattera, Deplin, Vyvanse, Evekeo, Concerta 27 SE, bupropion fluoxetine, Lexapro, sertraline 37.5 side effects, venlafaxine, duloxetine, Pristiq, paroxetine, citalopram,  buspirone,  Abilify 2 marked benefit clonazepam, Xanax,    Hydroxyzine Last counseling years ago.  Review of Systems:  Review of Systems  Cardiovascular: Negative for palpitations.  Musculoskeletal: Positive for back pain.  Neurological: Negative for tremors and weakness.  Psychiatric/Behavioral: Negative for agitation, behavioral problems, confusion, decreased concentration, dysphoric mood, hallucinations, self-injury, sleep disturbance and suicidal ideas. The patient is not nervous/anxious and is not hyperactive.     Medications: I have  reviewed the patient's current medications.  Current Outpatient Medications  Medication Sig Dispense Refill  . ALPRAZolam (XANAX) 0.5 MG tablet TAKE 1/2 TO 1 (ONE-HALF TO ONE) TABLET BY MOUTH AT BEDTIME AS NEEDED FOR ANXIETY SLEEP 30 tablet 2  . ARIPiprazole (ABILIFY) 2 MG tablet Take 1 tablet (2 mg total) by mouth daily. 90 tablet 1  . b complex vitamins capsule Take 1 capsule by mouth daily.    Marland Kitchen CALCIUM PO Take by mouth.    Marland Kitchen FLUoxetine (PROZAC) 20 MG capsule Take 1 capsule (20 mg total) by mouth daily. 90 capsule 1  . Ivermectin (SOOLANTRA) 1 % CREA Apply 1 application topically daily. 30 g 2  . methylphenidate 18 MG PO CR tablet Take 1 tablet (18 mg total) by mouth daily. 30 tablet 0  . NONFORMULARY OR COMPOUNDED ITEM 2% metronidazole ( crush tab or powder) in 1% hydrocortisone cream qsad 4 oz 1 each 0  . Omega-3 1000 MG CAPS Take by mouth.    . vitamin C (ASCORBIC ACID) 500 MG tablet Take  500 mg by mouth daily.     No current facility-administered medications for this visit.    Medication Side Effects: None  Allergies:  Allergies  Allergen Reactions  . Biaxin [Clarithromycin]     Upset stomach  . Nsaids Rash    Past Medical History:  Diagnosis Date  . Arthritis     History reviewed. No pertinent family history.  Social History   Socioeconomic History  . Marital status: Divorced    Spouse name: Not on file  . Number of children: Not on file  . Years of education: Not on file  . Highest education level: Not on file  Occupational History  . Not on file  Tobacco Use  . Smoking status: Never Smoker  . Smokeless tobacco: Never Used  Substance and Sexual Activity  . Alcohol use: Never  . Drug use: Never  . Sexual activity: Not on file  Other Topics Concern  . Not on file  Social History Narrative  . Not on file   Social Determinants of Health   Financial Resource Strain: Not on file  Food Insecurity: Not on file  Transportation Needs: Not on file   Physical Activity: Not on file  Stress: Not on file  Social Connections: Not on file  Intimate Partner Violence: Not on file    Past Medical History, Surgical history, Social history, and Family history were reviewed and updated as appropriate.   Please see review of systems for further details on the patient's review from today.   Objective:   Physical Exam:  BP 111/75   Pulse 74   Physical Exam Constitutional:      General: She is not in acute distress.    Appearance: She is well-developed.  Musculoskeletal:        General: No deformity.  Neurological:     Mental Status: She is alert and oriented to person, place, and time.     Motor: No tremor.     Coordination: Coordination normal.     Gait: Gait normal.  Psychiatric:        Attention and Perception: Perception normal. She is attentive.        Mood and Affect: Mood is not anxious or depressed. Affect is not labile, blunt, angry or inappropriate.        Speech: Speech normal.        Behavior: Behavior normal.        Thought Content: Thought content normal. Thought content does not include homicidal or suicidal ideation. Thought content does not include homicidal or suicidal plan.        Cognition and Memory: Cognition normal.        Judgment: Judgment normal.     Comments: Insight and judgment good. No auditory or visual hallucinations. No delusions.        Lab Review:     Component Value Date/Time   NA 143 09/05/2017 1535   K 3.7 09/05/2017 1535   CL 106 09/05/2017 1535   CO2 29 09/05/2017 1535   GLUCOSE 99 09/05/2017 1535   BUN 14 09/05/2017 1535   CREATININE 0.69 09/05/2017 1535   CALCIUM 9.9 09/05/2017 1535   GFRNONAA >60 09/05/2017 1535   GFRAA >60 09/05/2017 1535       Component Value Date/Time   WBC 7.1 09/05/2017 1535   RBC 4.71 09/05/2017 1535   HGB 14.5 09/05/2017 1535   HCT 42.6 09/05/2017 1535   PLT 206 09/05/2017 1535   MCV 90.4 09/05/2017 1535  MCH 30.8 09/05/2017 1535   MCHC 34.0  09/05/2017 1535   RDW 11.6 09/05/2017 1535   LYMPHSABS 2.1 01/11/2009 1400   MONOABS 0.7 01/11/2009 1400   EOSABS 0.0 01/11/2009 1400   BASOSABS 0.0 01/11/2009 1400    No results found for: POCLITH, LITHIUM   No results found for: PHENYTOIN, PHENOBARB, VALPROATE, CBMZ   .res Assessment: Plan:    Depression, major, recurrent, in complete remission ([place])  Attention deficit hyperactivity disorder (ADHD), predominantly inattentive type - Plan: methylphenidate 18 MG PO CR tablet  Generalized anxiety disorder  Insomnia due to mental condition   Worse than usual depression with clear connection with unresolved grief over divorce.  Rec counseling.  Preferred over change in meds again.  Consider Abilify. Disc options. Alyssa Chou PhD.    Disc letter writing.  She has journals.  Potentiate Abilify 2 mg tablet daily worked with resolution of depression Discussed potential metabolic side effects associated with atypical antipsychotics, as well as potential risk for movement side effects. Advised pt to contact office if movement side effects occur.  At this ultralow dose side effect risk would be minimal.  Generally she is med sensitive so would expect to see some benefit at low dosage.  Consider alternative Trintellix but it might not be affordable.  Disc med sensitivity, discussed her concerns in general fearfulness around medications.  Failed multiple stimulants DT tolerability px.  Probably can't afford liquid stimulant which is the only option left.  Continue Ritalin as needed  cont fluoxetine to 20 mg daily.  Call if any SE.   Discussed potential benefits, risks, and side effects of stimulants with patient to include increased heart rate, palpitations, insomnia, increased anxiety, increased irritability, or decreased appetite.  Instructed patient to contact office if experiencing any significant tolerability issues. Switch to LA Concerta 36 mg AM.  Call if too much.  We discussed the  short-term risks associated with benzodiazepines including sedation and increased fall risk among others.  Discussed long-term side effect risk including dependence, potential withdrawal symptoms, and the potential eventual dose-related risk of dementia.  Disc unusual combo stimulants but is serving good medical purpose.  Disc way to taper off BZ if possible.  She might try it.  FU 3 mos  Lynder Parents, MD, DFAPA   Please see After Visit Summary for patient specific instructions.  No future appointments.  No orders of the defined types were placed in this encounter.     -------------------------------

## 2020-09-10 ENCOUNTER — Telehealth: Payer: Self-pay | Admitting: Psychiatry

## 2020-09-10 ENCOUNTER — Other Ambulatory Visit: Payer: Self-pay | Admitting: Psychiatry

## 2020-09-10 MED ORDER — METHYLPHENIDATE HCL 5 MG PO TABS: 5.0000 mg | ORAL_TABLET | Freq: Two times a day (BID) | ORAL | 0 refills | Status: DC

## 2020-09-10 NOTE — Telephone Encounter (Signed)
Pt called and said that the concerta extended release is not agreeing with her at all. She would like to go back to the ritalin 5 mg. Please give her a call and let her know if that can happen at [phone]

## 2020-09-10 NOTE — Telephone Encounter (Signed)
Rx sent ritalin 5 BID

## 2020-09-10 NOTE — Telephone Encounter (Signed)
Rtc to pt she stated in the past concerta caused her to be jittery/nervous and its still doing that to her now.She also reports burning eyes.She stated ritalin works without giving her side affects.[facility] is using [facility] on [address]

## 2020-09-11 NOTE — Telephone Encounter (Signed)
Prior approval received for METHYLPHENIDATE 18 MG CR effective 09/07/2020-09/08/2023 with [facility] [id number]

## 2020-09-29 ENCOUNTER — Telehealth: Payer: Self-pay | Admitting: Psychiatry

## 2020-09-29 ENCOUNTER — Other Ambulatory Visit: Payer: Self-pay | Admitting: Psychiatry

## 2020-09-29 MED ORDER — METHYLPHENIDATE HCL 5 MG PO TABS: 5.0000 mg | ORAL_TABLET | Freq: Three times a day (TID) | ORAL | 0 refills | Status: DC

## 2020-09-29 NOTE — Telephone Encounter (Signed)
It was apparently a mistake.  Prescription was changed to 3 times daily.  Prescription sent

## 2020-09-29 NOTE — Telephone Encounter (Signed)
Next visit is 11/23/20. Alyssa Vaughn called asking why her recent prescription for Methylphenidate was changed to 2/day. She said that it has been 3/day. Her phone number is [phone].

## 2020-09-29 NOTE — Telephone Encounter (Signed)
Please review

## 2020-10-07 DIAGNOSIS — H43811 Vitreous degeneration, right eye: Secondary | ICD-10-CM | POA: Diagnosis not present

## 2020-10-07 DIAGNOSIS — H35371 Puckering of macula, right eye: Secondary | ICD-10-CM | POA: Diagnosis not present

## 2020-10-07 DIAGNOSIS — Z961 Presence of intraocular lens: Secondary | ICD-10-CM | POA: Diagnosis not present

## 2020-10-14 ENCOUNTER — Ambulatory Visit: Payer: Medicare HMO | Admitting: Psychiatry

## 2020-11-03 DIAGNOSIS — R1013 Epigastric pain: Secondary | ICD-10-CM | POA: Diagnosis not present

## 2020-11-03 DIAGNOSIS — R110 Nausea: Secondary | ICD-10-CM | POA: Diagnosis not present

## 2020-11-16 ENCOUNTER — Other Ambulatory Visit: Payer: Self-pay

## 2020-11-16 ENCOUNTER — Telehealth: Payer: Self-pay | Admitting: Psychiatry

## 2020-11-16 MED ORDER — METHYLPHENIDATE HCL 5 MG PO TABS: 5.0000 mg | ORAL_TABLET | Freq: Three times a day (TID) | ORAL | 0 refills | Status: DC

## 2020-11-16 NOTE — Telephone Encounter (Signed)
Please send in Ritalin RX. Next appt 8/22 Send to [place] [address]

## 2020-11-16 NOTE — Telephone Encounter (Signed)
Pended.

## 2020-11-23 ENCOUNTER — Ambulatory Visit: Payer: Medicare HMO | Admitting: Psychiatry

## 2020-11-25 DIAGNOSIS — M5431 Sciatica, right side: Secondary | ICD-10-CM | POA: Diagnosis not present

## 2020-11-25 DIAGNOSIS — R69 Illness, unspecified: Secondary | ICD-10-CM | POA: Diagnosis not present

## 2020-12-18 DIAGNOSIS — M545 Low back pain, unspecified: Secondary | ICD-10-CM | POA: Diagnosis not present

## 2020-12-18 DIAGNOSIS — M533 Sacrococcygeal disorders, not elsewhere classified: Secondary | ICD-10-CM | POA: Diagnosis not present

## 2020-12-18 DIAGNOSIS — M5459 Other low back pain: Secondary | ICD-10-CM | POA: Diagnosis not present

## 2020-12-21 ENCOUNTER — Other Ambulatory Visit: Payer: Self-pay | Admitting: Physician Assistant

## 2020-12-21 DIAGNOSIS — M5459 Other low back pain: Secondary | ICD-10-CM

## 2020-12-29 DIAGNOSIS — M545 Low back pain, unspecified: Secondary | ICD-10-CM | POA: Diagnosis not present

## 2020-12-30 ENCOUNTER — Ambulatory Visit: Payer: Medicare HMO | Admitting: Psychiatry

## 2021-01-07 DIAGNOSIS — H02889 Meibomian gland dysfunction of unspecified eye, unspecified eyelid: Secondary | ICD-10-CM | POA: Diagnosis not present

## 2021-01-07 DIAGNOSIS — H00015 Hordeolum externum left lower eyelid: Secondary | ICD-10-CM | POA: Diagnosis not present

## 2021-01-08 DIAGNOSIS — E538 Deficiency of other specified B group vitamins: Secondary | ICD-10-CM | POA: Diagnosis not present

## 2021-01-08 DIAGNOSIS — M5136 Other intervertebral disc degeneration, lumbar region: Secondary | ICD-10-CM | POA: Diagnosis not present

## 2021-01-08 DIAGNOSIS — R202 Paresthesia of skin: Secondary | ICD-10-CM | POA: Diagnosis not present

## 2021-01-08 DIAGNOSIS — Z23 Encounter for immunization: Secondary | ICD-10-CM | POA: Diagnosis not present

## 2021-01-08 DIAGNOSIS — Z79899 Other long term (current) drug therapy: Secondary | ICD-10-CM | POA: Diagnosis not present

## 2021-01-08 DIAGNOSIS — E559 Vitamin D deficiency, unspecified: Secondary | ICD-10-CM | POA: Diagnosis not present

## 2021-01-13 ENCOUNTER — Telehealth: Payer: Self-pay

## 2021-01-13 ENCOUNTER — Telehealth: Payer: Self-pay | Admitting: Psychiatry

## 2021-01-13 MED ORDER — METHYLPHENIDATE HCL 5 MG PO TABS: 5.0000 mg | ORAL_TABLET | Freq: Three times a day (TID) | ORAL | 0 refills | Status: DC

## 2021-01-13 NOTE — Telephone Encounter (Signed)
Pt LVM for refill of Ritalin.  Next appt 11/30

## 2021-01-13 NOTE — Telephone Encounter (Signed)
Pended.

## 2021-01-20 DIAGNOSIS — R69 Illness, unspecified: Secondary | ICD-10-CM | POA: Diagnosis not present

## 2021-01-20 DIAGNOSIS — E7800 Pure hypercholesterolemia, unspecified: Secondary | ICD-10-CM | POA: Diagnosis not present

## 2021-01-20 DIAGNOSIS — M5136 Other intervertebral disc degeneration, lumbar region: Secondary | ICD-10-CM | POA: Diagnosis not present

## 2021-01-20 DIAGNOSIS — Z79899 Other long term (current) drug therapy: Secondary | ICD-10-CM | POA: Diagnosis not present

## 2021-01-20 DIAGNOSIS — D692 Other nonthrombocytopenic purpura: Secondary | ICD-10-CM | POA: Diagnosis not present

## 2021-01-20 DIAGNOSIS — Z0000 Encounter for general adult medical examination without abnormal findings: Secondary | ICD-10-CM | POA: Diagnosis not present

## 2021-01-20 DIAGNOSIS — E559 Vitamin D deficiency, unspecified: Secondary | ICD-10-CM | POA: Diagnosis not present

## 2021-01-20 DIAGNOSIS — R5382 Chronic fatigue, unspecified: Secondary | ICD-10-CM | POA: Diagnosis not present

## 2021-01-20 DIAGNOSIS — M5030 Other cervical disc degeneration, unspecified cervical region: Secondary | ICD-10-CM | POA: Diagnosis not present

## 2021-01-20 DIAGNOSIS — E538 Deficiency of other specified B group vitamins: Secondary | ICD-10-CM | POA: Diagnosis not present

## 2021-01-20 DIAGNOSIS — Z8619 Personal history of other infectious and parasitic diseases: Secondary | ICD-10-CM | POA: Diagnosis not present

## 2021-02-03 DIAGNOSIS — M5416 Radiculopathy, lumbar region: Secondary | ICD-10-CM | POA: Diagnosis not present

## 2021-02-06 ENCOUNTER — Other Ambulatory Visit: Payer: Self-pay | Admitting: Psychiatry

## 2021-02-06 DIAGNOSIS — F331 Major depressive disorder, recurrent, moderate: Secondary | ICD-10-CM

## 2021-02-06 DIAGNOSIS — F411 Generalized anxiety disorder: Secondary | ICD-10-CM

## 2021-02-07 ENCOUNTER — Other Ambulatory Visit: Payer: Self-pay | Admitting: Psychiatry

## 2021-02-07 DIAGNOSIS — F5105 Insomnia due to other mental disorder: Secondary | ICD-10-CM

## 2021-02-10 ENCOUNTER — Other Ambulatory Visit: Payer: Self-pay

## 2021-02-10 DIAGNOSIS — M5416 Radiculopathy, lumbar region: Secondary | ICD-10-CM | POA: Diagnosis not present

## 2021-02-10 NOTE — Telephone Encounter (Signed)
Pt called requesting Rx for Ritalin 5 mg @ [facility]. Apt 11/30

## 2021-02-11 MED ORDER — METHYLPHENIDATE HCL 5 MG PO TABS: 5.0000 mg | ORAL_TABLET | Freq: Three times a day (TID) | ORAL | 0 refills | Status: DC

## 2021-02-19 DIAGNOSIS — M5416 Radiculopathy, lumbar region: Secondary | ICD-10-CM | POA: Diagnosis not present

## 2021-03-01 DIAGNOSIS — M5451 Vertebrogenic low back pain: Secondary | ICD-10-CM | POA: Diagnosis not present

## 2021-03-01 DIAGNOSIS — M5416 Radiculopathy, lumbar region: Secondary | ICD-10-CM | POA: Diagnosis not present

## 2021-03-02 DIAGNOSIS — M5416 Radiculopathy, lumbar region: Secondary | ICD-10-CM | POA: Diagnosis not present

## 2021-03-03 ENCOUNTER — Encounter: Payer: Self-pay | Admitting: Psychiatry

## 2021-03-03 ENCOUNTER — Other Ambulatory Visit: Payer: Self-pay

## 2021-03-03 ENCOUNTER — Ambulatory Visit: Payer: Medicare HMO | Admitting: Psychiatry

## 2021-03-03 DIAGNOSIS — R69 Illness, unspecified: Secondary | ICD-10-CM | POA: Diagnosis not present

## 2021-03-03 DIAGNOSIS — F331 Major depressive disorder, recurrent, moderate: Secondary | ICD-10-CM | POA: Diagnosis not present

## 2021-03-03 DIAGNOSIS — F3341 Major depressive disorder, recurrent, in partial remission: Secondary | ICD-10-CM

## 2021-03-03 DIAGNOSIS — F411 Generalized anxiety disorder: Secondary | ICD-10-CM | POA: Diagnosis not present

## 2021-03-03 DIAGNOSIS — F900 Attention-deficit hyperactivity disorder, predominantly inattentive type: Secondary | ICD-10-CM

## 2021-03-03 DIAGNOSIS — F5105 Insomnia due to other mental disorder: Secondary | ICD-10-CM | POA: Diagnosis not present

## 2021-03-03 MED ORDER — ALPRAZOLAM 0.5 MG PO TABS: ORAL_TABLET | ORAL | 5 refills | Status: DC

## 2021-03-03 MED ORDER — FLUOXETINE HCL 20 MG PO CAPS
20.0000 mg | ORAL_CAPSULE | Freq: Every day | ORAL | 1 refills | Status: DC
Start: 2021-03-03 — End: 2021-07-28

## 2021-03-03 NOTE — Progress Notes (Signed)
Alyssa Vaughn [id number] [birth year] 70 y.o.  Subjective:   Patient ID:  Alyssa Vaughn is a 70 y.o. (DOB 1950/07/13) female.  Chief Complaint:  Chief Complaint  Patient presents with   Follow-up   Depression   Anxiety   ADD    Anxiety Patient reports no chest pain, confusion, decreased concentration, nervous/anxious behavior, palpitations or suicidal ideas.    Depression        Associated symptoms include no decreased concentration and no suicidal ideas. Alyssa Vaughn presents to the office today for follow-up of increased fluoxetine for worsening depression without reason.    seen October 2020.  No meds were changed.  Fluoxetine 20 mg was continued.  07/09/2019 appointment the following is noted: Wants to try Quillivent bc easily confused, distracted and unfocused as noted. Good overall.  Fluoxetine helps anxiety and tears. Only taking Xanax 1/2 tablet at night.   Still ADD and head does not feel awake.  Hard to concentrate with groups.  But also doesn't feel quite awake and alert usually.   Feel like my brain won't wake up.  Tried modafinil 50 mg but nervous so much couldn't evaluate effect on ADD.  Has tried lots of ADD meds and didn't tolerate them. Grand kids in best.  [name] murdered but he hasn't been in the picture.  Pleased she's been active.  Not generally self-motivated and pleased she's stayed busy. 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  I just have to let it go.   Can't focus on [place].  She periodically struggles with sadness over the brokenness in the relationship.  Recognizes she craves his attention.  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  Don't need clonazepam at any other time.  Patient denies difficulty with sleep initiation or maintenance. 7-8 hours.  Denies appetite disturbance.  Patient reports that energy and motivation have been good.  Patient denies any  suicidal ideation.  No longer depressed usually and anxious but episodic.  Klonopin good once daily for anxiety.  Xanax for sleep ok. Plan: Start Quillivant 1 ml daily and may increase by 1 ml weekly as tolerated up to about 3-4 ml  09/03/2019 phone call that the [facility] was too expensive and wanted a prescription for Ritalin 5 mg which was sent.  09/09/2019 appointment with the following noted: Ritalin helpful.  takiing up to BID and it makes a big difference in focus and productivity. Tolerating it well.   Taking a little alprazolam at night usually about 1/4 of 0.5 mg night.y.  Not taking clonazepam. Asks about taper BZ. Overall feels the best she's been.  After years of divorced she feels more free. Less fear spiritually now. No med changes  03/10/20 appt with following noted: Not taking stimulants. Taking 0.125 mg alprazolam HS.  Makes her sleepy a little.  Not sure if she needs it.  Wonders about stopping .   Overall good and bad.  Up and down mood with a lot of back problems which she's working on.  Sometimes unmanageable. Low days fight with herself, tired and uninterested.  Stress and obsess over [place]. Divorced 2008 and still not over it.  Still no life for herself. Don't get involved with things.  Only looks forward to popcorn and going to bed early.  Everything is drudgery and stay unmotivated. These sx come and go.  It's always there in the background but waxes and wanes.  Working on it. Plan no changes and continue fluoxetine and Ritalin.  06/02/2020 appointment with following noted: Not taking Ritalin.  Continued fluoxetine.   Tried to address her issues with [name].  Other stuff still there with cry9ing and hard to focus and get through the day.  Weary of trying meds.  Trying to work on herself with self help.  Tried diet and exercise without change in sx.  Fear of going out witiut a reason.  Feels stuck. Last filled Ritalin Augst 2021. Still depressed.  Struggles to get things  done. Recent normal physical.  Plan:  Potentiate Abilify 2 mg tablet 1/2 daily for 2 weeks, then 1 tablet for sig depression  07/21/2020 appt noted: Good with aripiprazole it helped a lot noticeably, smooth Taking Abilify 2 mg daily. Got rid of heavy feeling of depresssion.  No crying.  Easier to do things and go places No SE with meds. Restarted Ritalin 5 mg TID too low.  Wants ER. Needs equivalent of 10 TID.  Helps the confusion of the day and improves focus and completion.  Good combo with Abilify. Plan:  Switch to LA Concerta 36 mg AM.  Call if too much.  09/01/2020 appointment noted: She called a couple times since being here and found both the 36 mg size and the 27 mg size of Concerta to be "too much" including symptoms of tension and anxiety and wanted to switch back to the Ritalin 5 mg 3 times daily.  She was offered the option of going to the lowest dose of Concerta 18 mg but declined. Ritalin 5 TID not very effective.  Would like to try lower dose of Concerta. Mood still very good and unusual for me.  Benefit Abilify.  Working on herself.   Plan for depression recommend potentiation with Abilify up to 2 mg daily.  Sleep is good.    03/03/2021 appointment with the following noted: Not taking Ritalin DT SE and up/down feelings from it.  It is helpful but not necessary. Saw benefit with Abilify in the beginning but didn't feel like herself.  Helped the depression and easier to go out and do things. Took the Abilify for 3-4 mos.  , Overall doing OK.  Better situation in the family with good grandparenting with [name] and does things with [place] socially and it helped.  Better sense of focus and direction for her life and less demanding of him.  Good relationship with [name] and the family.  Settling more into retirement.  Anxiety manageable. Exercise more consistent.  Past Psychiatric Medication Trials: Modafinil SE, methylphenidate tablets,  , Dextrostat, Adderall, Ritalin,  Strattera, Deplin, Vyvanse, Evekeo, Concerta 27 SE, bupropion fluoxetine, Lexapro, sertraline 37.5 side effects, venlafaxine, duloxetine, Pristiq, paroxetine, citalopram,  buspirone,  Abilify 2 marked benefit but CO some SE and stopped. Clonazepam sed, Xanax,    Hydroxyzine Last counseling years ago.  Review of Systems:  Review of Systems  Cardiovascular:  Negative for chest pain and palpitations.  Musculoskeletal:  Positive for back pain.  Neurological:  Negative for tremors and weakness.  Psychiatric/Behavioral:  Positive for depression. Negative for agitation, behavioral problems, confusion, decreased concentration, dysphoric mood, hallucinations, self-injury, sleep disturbance and suicidal ideas. The patient is not nervous/anxious and is not hyperactive.    Medications: I have reviewed the patient's current medications.  Current Outpatient Medications  Medication Sig Dispense Refill   ALPRAZolam (XANAX) 0.5 MG tablet TAKE 1/2 TO 1 (ONE-HALF TO ONE) TABLET BY MOUTH AT BEDTIME AS NEEDED FOR ANXIETY OR  SLEEP 30 tablet 0   b complex vitamins capsule Take 1 capsule by mouth daily.     CALCIUM PO Take by mouth.     FLUoxetine (PROZAC) 20 MG capsule Take 1 capsule by mouth once daily 90 capsule 0   Ivermectin (SOOLANTRA) 1 % CREA Apply 1 application topically daily. 30 g 2   NONFORMULARY OR COMPOUNDED ITEM 2% metronidazole ( crush tab or powder) in 1% hydrocortisone cream qsad 4 oz 1 each 0   Omega-3 1000 MG CAPS Take by mouth.     vitamin C (ASCORBIC ACID) 500 MG tablet Take 500 mg by mouth daily.     ARIPiprazole (ABILIFY) 2 MG tablet Take 1 tablet (2 mg total) by mouth daily. (Patient not taking: Reported on 03/03/2021) 90 tablet 1   methylphenidate (RITALIN) 5 MG tablet Take 1 tablet (5 mg total) by mouth 3 (three) times daily with meals. (Patient not taking: Reported on 03/03/2021) 90 tablet 0   No current facility-administered medications for this visit.    Medication Side  Effects: None  Allergies:  Allergies  Allergen Reactions   Biaxin [Clarithromycin]     Upset stomach   Nsaids Rash    Past Medical History:  Diagnosis Date   Arthritis     History reviewed. No pertinent family history.  Social History   Socioeconomic History   Marital status: Divorced    Spouse name: Not on file   Number of children: Not on file   Years of education: Not on file   Highest education level: Not on file  Occupational History   Not on file  Tobacco Use   Smoking status: Never   Smokeless tobacco: Never  Substance and Sexual Activity   Alcohol use: Never   Drug use: Never   Sexual activity: Not on file  Other Topics Concern   Not on file  Social History Narrative   Not on file   Social Determinants of Health   Financial Resource Strain: Not on file  Food Insecurity: Not on file  Transportation Needs: Not on file  Physical Activity: Not on file  Stress: Not on file  Social Connections: Not on file  Intimate Partner Violence: Not on file    Past Medical History, Surgical history, Social history, and Family history were reviewed and updated as appropriate.   Please see review of systems for further details on the patient's review from today.   Objective:   Physical Exam:  There were no vitals taken for this visit.  Physical Exam Constitutional:      General: She is not in acute distress.    Appearance: She is well-developed.  Musculoskeletal:        General: No deformity.  Neurological:     Mental Status: She is alert and oriented to person, place, and time.     Motor: No tremor.     Coordination: Coordination normal.     Gait: Gait normal.  Psychiatric:        Attention and Perception: Perception normal. She is attentive.        Mood and Affect: Mood is anxious. Mood is not depressed. Affect is not labile, blunt, angry or inappropriate.        Speech: Speech normal.        Behavior: Behavior normal.        Thought Content: Thought  content normal. Thought content does not include homicidal or suicidal ideation. Thought content does not include homicidal or suicidal plan.  Cognition and Memory: Cognition normal.        Judgment: Judgment normal.     Comments: Insight and judgment good. No auditory or visual hallucinations. No delusions.  Minimal depression      Lab Review:     Component Value Date/Time   NA 143 09/05/2017 1535   K 3.7 09/05/2017 1535   CL 106 09/05/2017 1535   CO2 29 09/05/2017 1535   GLUCOSE 99 09/05/2017 1535   BUN 14 09/05/2017 1535   CREATININE 0.69 09/05/2017 1535   CALCIUM 9.9 09/05/2017 1535   GFRNONAA >60 09/05/2017 1535   GFRAA >60 09/05/2017 1535       Component Value Date/Time   WBC 7.1 09/05/2017 1535   RBC 4.71 09/05/2017 1535   HGB 14.5 09/05/2017 1535   HCT 42.6 09/05/2017 1535   PLT 206 09/05/2017 1535   MCV 90.4 09/05/2017 1535   MCH 30.8 09/05/2017 1535   MCHC 34.0 09/05/2017 1535   RDW 11.6 09/05/2017 1535   LYMPHSABS 2.1 01/11/2009 1400   MONOABS 0.7 01/11/2009 1400   EOSABS 0.0 01/11/2009 1400   BASOSABS 0.0 01/11/2009 1400    No results found for: POCLITH, LITHIUM   No results found for: PHENYTOIN, PHENOBARB, VALPROATE, CBMZ   .res Assessment: Plan:    Depression, major, recurrent, in partial remission ([place])  Attention deficit hyperactivity disorder (ADHD), predominantly inattentive type  Generalized anxiety disorder  Insomnia due to mental condition   Worse than usual depression with clear connection with unresolved grief over divorce.  Rec counseling.  Preferred over change in meds again.  Consider Abilify. Disc options. Alvester Chou PhD.    Disc letter writing.  She has journals.  Option Potentiate Abilify 2 mg tablet QOD if needed bc 2 mg  worked with resolution of depression but made her feel a little altered.  Discussed potential metabolic side effects associated with atypical antipsychotics, as well as potential risk for movement side  effects. Advised pt to contact office if movement side effects occur.  At this ultralow dose side effect risk would be minimal.  Generally she is med sensitive so would expect to see some benefit at low dosage.  Consider alternative Trintellix but it might not be affordable.  Disc med sensitivity, discussed her concerns in general fearfulness around medications.  Failed multiple stimulants DT tolerability px.  Probably can't afford liquid stimulant which is the only option left.  Continue Ritalin as needed  cont fluoxetine to 20 mg daily.  Call if any SE.   Discussed potential benefits, risks, and side effects of stimulants with patient to include increased heart rate, palpitations, insomnia, increased anxiety, increased irritability, or decreased appetite.  Instructed patient to contact office if experiencing any significant tolerability issues. Failed to tolerate Concerta 36 and 27 mg.  Has the option of trying the 18 mg.  We discussed the short-term risks associated with benzodiazepines including sedation and increased fall risk among others.  Discussed long-term side effect risk including dependence, potential withdrawal symptoms, and the potential eventual dose-related risk of dementia.  Disc unusual combo stimulants but is serving good medical purpose.  Still takes Xanax 0.125-0.25 HS  FU 6 mos  Lynder Parents, MD, DFAPA   Please see After Visit Summary for patient specific instructions.  No future appointments.  No orders of the defined types were placed in this encounter.     -------------------------------

## 2021-03-08 DIAGNOSIS — Z124 Encounter for screening for malignant neoplasm of cervix: Secondary | ICD-10-CM | POA: Diagnosis not present

## 2021-03-18 ENCOUNTER — Other Ambulatory Visit: Payer: Self-pay | Admitting: Psychiatry

## 2021-03-18 DIAGNOSIS — F5105 Insomnia due to other mental disorder: Secondary | ICD-10-CM

## 2021-04-11 ENCOUNTER — Other Ambulatory Visit: Payer: Self-pay | Admitting: Psychiatry

## 2021-04-11 DIAGNOSIS — F411 Generalized anxiety disorder: Secondary | ICD-10-CM

## 2021-04-11 DIAGNOSIS — F331 Major depressive disorder, recurrent, moderate: Secondary | ICD-10-CM

## 2021-05-04 ENCOUNTER — Ambulatory Visit: Payer: BC Managed Care – PPO | Admitting: Dermatology

## 2021-05-04 DIAGNOSIS — R059 Cough, unspecified: Secondary | ICD-10-CM | POA: Diagnosis not present

## 2021-05-04 DIAGNOSIS — U071 COVID-19: Secondary | ICD-10-CM | POA: Diagnosis not present

## 2021-05-04 DIAGNOSIS — R509 Fever, unspecified: Secondary | ICD-10-CM | POA: Diagnosis not present

## 2021-05-04 DIAGNOSIS — R0981 Nasal congestion: Secondary | ICD-10-CM | POA: Diagnosis not present

## 2021-05-12 ENCOUNTER — Ambulatory Visit: Payer: BC Managed Care – PPO | Admitting: Dermatology

## 2021-05-19 DIAGNOSIS — H35371 Puckering of macula, right eye: Secondary | ICD-10-CM | POA: Diagnosis not present

## 2021-06-28 ENCOUNTER — Telehealth: Payer: Self-pay | Admitting: Psychiatry

## 2021-06-28 NOTE — Telephone Encounter (Signed)
Pt called and LM 9:14am. She would like to restart her Methylphenidate. Send to : ?[facility] ?

## 2021-06-30 ENCOUNTER — Other Ambulatory Visit: Payer: Self-pay | Admitting: Psychiatry

## 2021-06-30 MED ORDER — METHYLPHENIDATE HCL 5 MG PO TABS: 5.0000 mg | ORAL_TABLET | Freq: Three times a day (TID) | ORAL | 0 refills | Status: DC

## 2021-06-30 NOTE — Telephone Encounter (Signed)
This is from Monday. ?

## 2021-06-30 NOTE — Telephone Encounter (Signed)
Sent!

## 2021-07-26 ENCOUNTER — Other Ambulatory Visit: Payer: Self-pay | Admitting: Psychiatry

## 2021-07-26 ENCOUNTER — Telehealth: Payer: Self-pay | Admitting: Psychiatry

## 2021-07-26 MED ORDER — METHYLPHENIDATE HCL 5 MG PO TABS: 5.0000 mg | ORAL_TABLET | Freq: Three times a day (TID) | ORAL | 0 refills | Status: DC

## 2021-07-26 NOTE — Telephone Encounter (Signed)
Pt lvm at 6:40 am asking for a refill on her ritalin 5 mg to be sent to the [facility] on [address] ?

## 2021-07-28 ENCOUNTER — Encounter: Payer: Self-pay | Admitting: Psychiatry

## 2021-07-28 ENCOUNTER — Ambulatory Visit: Payer: Medicare HMO | Admitting: Psychiatry

## 2021-07-28 DIAGNOSIS — F331 Major depressive disorder, recurrent, moderate: Secondary | ICD-10-CM | POA: Diagnosis not present

## 2021-07-28 DIAGNOSIS — F411 Generalized anxiety disorder: Secondary | ICD-10-CM

## 2021-07-28 DIAGNOSIS — F5105 Insomnia due to other mental disorder: Secondary | ICD-10-CM | POA: Diagnosis not present

## 2021-07-28 DIAGNOSIS — F900 Attention-deficit hyperactivity disorder, predominantly inattentive type: Secondary | ICD-10-CM | POA: Diagnosis not present

## 2021-07-28 DIAGNOSIS — R69 Illness, unspecified: Secondary | ICD-10-CM | POA: Diagnosis not present

## 2021-07-28 MED ORDER — FLUOXETINE HCL 10 MG PO CAPS: 10.0000 mg | ORAL_CAPSULE | Freq: Every day | ORAL | 0 refills | Status: DC

## 2021-07-28 MED ORDER — BUPROPION HCL ER (XL) 150 MG PO TB24: 150.0000 mg | ORAL_TABLET | Freq: Every day | ORAL | 1 refills | Status: DC

## 2021-07-28 NOTE — Progress Notes (Signed)
Infinity Jeffords Amberg ?[id number] ?Mar 09, 1951 ?71 y.o. ? ?Subjective:  ? ?Patient ID:  Alyssa Vaughn is a 71 y.o. (DOB October 25, 1950) female. ? ?Chief Complaint:  ?Chief Complaint  ?Patient presents with  ? Follow-up  ? ADD  ? Depression  ? ? ?Anxiety ?Symptoms include decreased concentration. Patient reports no chest pain, confusion, nervous/anxious behavior, palpitations or suicidal ideas.  ? ? ?Depression ?       Associated symptoms include decreased concentration.  Associated symptoms include no suicidal ideas.  Past medical history includes anxiety.   ?Alyssa Vaughn presents to the office today for follow-up of increased fluoxetine for worsening depression without reason.   ? ?seen October 2020.  No meds were changed.  Fluoxetine 20 mg was continued. ? ?07/09/2019 appointment the following is noted: ?Wants to try Quillivent bc easily confused, distracted and unfocused as noted. ?Good overall.  Fluoxetine helps anxiety and tears. Only taking Xanax 1/2 tablet at night.   ?Still ADD and head does not feel awake.  Hard to concentrate with groups.  But also doesn't feel quite awake and alert usually.   Feel like my brain won't wake up.  Tried modafinil 50 mg but nervous so much couldn't evaluate effect on ADD.  Has tried lots of ADD meds and didn't tolerate them. ?[place] kids in best.  [name] murdered but he hasn't been in the picture.  Pleased she's been active.  Not generally self-motivated and pleased she's stayed busy. 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  I just have to let it go.   Can't focus on [place].  She periodically struggles with sadness over the brokenness in the relationship.  Recognizes she craves his attention.  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  Don't need clonazepam at any other time.  Patient denies difficulty with sleep initiation or maintenance. 7-8 hours.  Denies appetite disturbance.  Patient  reports that energy and motivation have been good.  Patient denies any suicidal ideation.  No longer depressed usually and anxious but episodic.  Klonopin good once daily for anxiety.  Xanax for sleep ok. ?Plan: Start Quillivant 1 ml daily and may increase by 1 ml weekly as tolerated up to about 3-4 ml ? ?09/03/2019 phone call that [name] was too expensive and wanted a prescription for Ritalin 5 mg which was sent.  ?09/09/2019 appointment with the following noted: ?Ritalin helpful.  takiing up to BID and it makes a big difference in focus and productivity. ?Tolerating it well.   ?Taking a little alprazolam at night usually about 1/4 of 0.5 mg night.y.  Not taking clonazepam. Asks about taper BZ. ?Overall feels the best she's been.  After years of divorced she feels more free. Less fear spiritually now. ?No med changes ? ?03/10/20 appt with following noted: ?Not taking stimulants. ?Taking 0.125 mg alprazolam HS.  Makes her sleepy a little.  Not sure if she needs it.  Wonders about stopping .   ?Overall good and bad.  Up and down mood with a lot of back problems which she's working on.  Sometimes unmanageable. ?Low days fight with herself, tired and uninterested.  Stress and obsess over [place]. Divorced 2008 and still not over it.  Still no life for herself. Don't get involved with things.  Only looks forward to popcorn and going to bed early.  Everything is drudgery and stay unmotivated. ?These sx come and go.  It's always there  in the background but waxes and wanes.   Working on it. ?Plan no changes and continue fluoxetine and Ritalin. ? ?06/02/2020 appointment with following noted: ?Not taking Ritalin.  Continued fluoxetine.   ?Tried to address her issues with [name].  Other stuff still there with cry9ing and hard to focus and get through the day.  Weary of trying meds.  Trying to work on herself with self help.  Tried diet and exercise without change in sx.  Fear of going out witiut a reason.  Feels  stuck. ?Last filled Ritalin Augst 2021. ?Still depressed.  Struggles to get things done. ?Recent normal physical.  ?Plan:  Potentiate Abilify 2 mg tablet 1/2 daily for 2 weeks, then 1 tablet for sig depression ? ?07/21/2020 appt noted: ?Good with aripiprazole it helped a lot noticeably, smooth ?Taking Abilify 2 mg daily. ?Got rid of heavy feeling of depresssion.  No crying.  Easier to do things and go places ?No SE with meds. ?Restarted Ritalin 5 mg TID too low.  Wants ER. Needs equivalent of 10 TID.  Helps the confusion of the day and improves focus and completion.  Good combo with Abilify. ?Plan:  Switch to LA Concerta 36 mg AM.  Call if too much. ? ?09/01/2020 appointment noted: ?She called a couple times since being here and found both the 36 mg size and the 27 mg size of Concerta to be "too much" including symptoms of tension and anxiety and wanted to switch back to the Ritalin 5 mg 3 times daily.  She was offered the option of going to the lowest dose of Concerta 18 mg but declined. ?Ritalin 5 TID not very effective.  Would like to try lower dose of Concerta. ?Mood still very good and unusual for me.  Benefit Abilify.  Working on herself.   ?Plan for depression recommend potentiation with Abilify up to 2 mg daily.  Sleep is good.   ? ?03/03/2021 appointment with the following noted: ?Not taking Ritalin DT SE and up/down feelings from it.  It is helpful but not necessary. ?Saw benefit with Abilify in the beginning but didn't feel like herself.  Helped the depression and easier to go out and do things. Took the Abilify for 3-4 mos.  , ?Overall doing OK.  Better situation in the family with good grandparenting with [name] and does things with [place] socially and it helped.  Better sense of focus and direction for her life and less demanding of him.  Good relationship with [name] and the family.  Settling more into retirement.  ?Anxiety manageable. ?Exercise more consistent. ? ?07/28/21 appt noted: ?Taking  fluoxetine 20 and Ritalin 5 mg TID. ?Without Ritalin sliding back into cycle of sadness, crying, poor focus.  Started Ritalin back and more motivation and concentration.Marland Kitchen ?Moved appt up. ?Still struggles with sadness and low interest and energy and low self esteem.  No SI.  Persistent depression and she fights it with diet and exercise and activity.  Feels heavy ?Asked about retring Wellbutrin ? ?Past Psychiatric Medication Trials: Modafinil SE, methylphenidate tablets,  ?, Dextrostat, Adderall, Ritalin, Strattera, Deplin, Vyvanse, Evekeo, Concerta 27 SE, ?bupropion ?fluoxetine, Lexapro, sertraline 37.5 side effects, venlafaxine, duloxetine, Pristiq, paroxetine, citalopram,  ?buspirone,  ?Abilify 2 marked benefit but CO some SE spacy and stopped. ?Clonazepam sed, Xanax,    Hydroxyzine ?Last counseling years ago. ? ?Review of Systems:  ?Review of Systems  ?Cardiovascular:  Negative for chest pain and palpitations.  ?Musculoskeletal:  Positive for back pain.  ?Neurological:  Negative for tremors and weakness.  ?Psychiatric/Behavioral:  Positive for decreased concentration and dysphoric mood. Negative for agitation, behavioral problems, confusion, hallucinations, self-injury, sleep disturbance and suicidal ideas. The patient is not nervous/anxious and is not hyperactive.   ? ?Medications: I have reviewed the patient's current medications. ? ?Current Outpatient Medications  ?Medication Sig Dispense Refill  ? ALPRAZolam (XANAX) 0.5 MG tablet TAKE 1/2 TO 1 TABLET BY MOUTH AT BEDTIME AS NEEDED FOR ANXIETY OR SLEEP 30 tablet 5  ? b complex vitamins capsule Take 1 capsule by mouth daily.    ? CALCIUM PO Take by mouth.    ? Ivermectin (SOOLANTRA) 1 % CREA Apply 1 application topically daily. 30 g 2  ? methylphenidate (RITALIN) 5 MG tablet Take 1 tablet (5 mg total) by mouth 3 (three) times daily with meals. 90 tablet 0  ? NONFORMULARY OR COMPOUNDED ITEM 2% metronidazole ( crush tab or powder) in 1% hydrocortisone cream ?qsad  4 oz 1 each 0  ? Omega-3 1000 MG CAPS Take by mouth.    ? vitamin C (ASCORBIC ACID) 500 MG tablet Take 500 mg by mouth daily.    ? buPROPion (WELLBUTRIN XL) 150 MG 24 hr tablet Take 1 tablet (150 mg total) by mouth daily.

## 2021-08-24 ENCOUNTER — Ambulatory Visit: Payer: Medicare HMO | Admitting: Psychiatry

## 2021-08-24 ENCOUNTER — Encounter: Payer: Self-pay | Admitting: Psychiatry

## 2021-08-24 DIAGNOSIS — F900 Attention-deficit hyperactivity disorder, predominantly inattentive type: Secondary | ICD-10-CM

## 2021-08-24 DIAGNOSIS — F5105 Insomnia due to other mental disorder: Secondary | ICD-10-CM | POA: Diagnosis not present

## 2021-08-24 DIAGNOSIS — F331 Major depressive disorder, recurrent, moderate: Secondary | ICD-10-CM

## 2021-08-24 DIAGNOSIS — R69 Illness, unspecified: Secondary | ICD-10-CM | POA: Diagnosis not present

## 2021-08-24 DIAGNOSIS — F411 Generalized anxiety disorder: Secondary | ICD-10-CM | POA: Diagnosis not present

## 2021-08-24 MED ORDER — METHYLPHENIDATE HCL 5 MG PO TABS
5.0000 mg | ORAL_TABLET | Freq: Three times a day (TID) | ORAL | 0 refills | Status: DC
Start: 2021-08-24 — End: 2021-08-27

## 2021-08-24 MED ORDER — ARIPIPRAZOLE 2 MG PO TABS: 2.0000 mg | ORAL_TABLET | Freq: Every day | ORAL | 1 refills | Status: DC

## 2021-08-24 NOTE — Progress Notes (Signed)
SACRED ROA [id number] 07-09-1950 71 y.o.  Subjective:   Patient ID:  Alyssa Vaughn is a 71 y.o. (DOB 11/12/1950) female.  Chief Complaint:  Chief Complaint  Patient presents with   Follow-up   Depression   ADD    Anxiety Symptoms include decreased concentration. Patient reports no chest pain, confusion, nervous/anxious behavior, palpitations or suicidal ideas.    Depression        Associated symptoms include decreased concentration.  Associated symptoms include no suicidal ideas.  Past medical history includes anxiety.   Alyssa Vaughn presents to the office today for follow-up of increased fluoxetine for worsening depression without reason.    seen October 2020.  No meds were changed.  Fluoxetine 20 mg was continued.  07/09/2019 appointment the following is noted: Wants to try Quillivent bc easily confused, distracted and unfocused as noted. Good overall.  Fluoxetine helps anxiety and tears. Only taking Xanax 1/2 tablet at night.   Still ADD and head does not feel awake.  Hard to concentrate with groups.  But also doesn't feel quite awake and alert usually.   Feel like my brain won't wake up.  Tried modafinil 50 mg but nervous so much couldn't evaluate effect on ADD.  Has tried lots of ADD meds and didn't tolerate them. Grand kids in best.  [name] murdered but he hasn't been in the picture.  Pleased she's been active.  Not generally self-motivated and pleased she's stayed busy. 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  I just have to let it go.   Can't focus on [place].  She periodically struggles with sadness over the brokenness in the relationship.  Recognizes she craves his attention.  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  Don't need clonazepam at any other time.  Patient denies difficulty with sleep initiation or maintenance. 7-8 hours.  Denies appetite disturbance.  Patient  reports that energy and motivation have been good.  Patient denies any suicidal ideation.  No longer depressed usually and anxious but episodic.  Klonopin good once daily for anxiety.  Xanax for sleep ok. Plan: Start Quillivant 1 ml daily and may increase by 1 ml weekly as tolerated up to about 3-4 ml  09/03/2019 phone call that the Laurence Ferrari was too expensive and wanted a prescription for Ritalin 5 mg which was sent.  09/09/2019 appointment with the following noted: Ritalin helpful.  takiing up to BID and it makes a big difference in focus and productivity. Tolerating it well.   Taking a little alprazolam at night usually about 1/4 of 0.5 mg night.y.  Not taking clonazepam. Asks about taper BZ. Overall feels the best she's been.  After years of divorced she feels more free. Less fear spiritually now. No med changes  03/10/20 appt with following noted: Not taking stimulants. Taking 0.125 mg alprazolam HS.  Makes her sleepy a little.  Not sure if she needs it.  Wonders about stopping .   Overall good and bad.  Up and down mood with a lot of back problems which she's working on.  Sometimes unmanageable. Low days fight with herself, tired and uninterested.  Stress and obsess over [place]. Divorced 2008 and still not over it.  Still no life for herself. Don't get involved with things.  Only looks forward to popcorn and going to bed early.  Everything is drudgery and stay unmotivated. These sx come and go.  It's always there  in the background but waxes and wanes.   Working on it. Plan no changes and continue fluoxetine and Ritalin.  06/02/2020 appointment with following noted: Not taking Ritalin.  Continued fluoxetine.   Tried to address her issues with [name].  Other stuff still there with cry9ing and hard to focus and get through the day.  Weary of trying meds.  Trying to work on herself with self help.  Tried diet and exercise without change in sx.  Fear of going out witiut a reason.  Feels  stuck. Last filled Ritalin Augst 2021. Still depressed.  Struggles to get things done. Recent normal physical.  Plan:  Potentiate Abilify 2 mg tablet 1/2 daily for 2 weeks, then 1 tablet for sig depression  07/21/2020 appt noted: Good with aripiprazole it helped a lot noticeably, smooth Taking Abilify 2 mg daily. Got rid of heavy feeling of depresssion.  No crying.  Easier to do things and go places No SE with meds. Restarted Ritalin 5 mg TID too low.  Wants ER. Needs equivalent of 10 TID.  Helps the confusion of the day and improves focus and completion.  Good combo with Abilify. Plan:  Switch to LA Concerta 36 mg AM.  Call if too much.  09/01/2020 appointment noted: She called a couple times since being here and found both the 36 mg size and the 27 mg size of Concerta to be "too much" including symptoms of tension and anxiety and wanted to switch back to the Ritalin 5 mg 3 times daily.  She was offered the option of going to the lowest dose of Concerta 18 mg but declined. Ritalin 5 TID not very effective.  Would like to try lower dose of Concerta. Mood still very good and unusual for me.  Benefit Abilify.  Working on herself.   Plan for depression recommend potentiation with Abilify up to 2 mg daily.  Sleep is good.    03/03/2021 appointment with the following noted: Not taking Ritalin DT SE and up/down feelings from it.  It is helpful but not necessary. Saw benefit with Abilify in the beginning but didn't feel like herself.  Helped the depression and easier to go out and do things. Took the Abilify for 3-4 mos.  , Overall doing OK.  Better situation in the family with good grandparenting with [name] and does things with [place] socially and it helped.  Better sense of focus and direction for her life and less demanding of him.  Good relationship with [name] and the family.  Settling more into retirement.  Anxiety manageable. Exercise more consistent.  07/28/21 appt noted: Taking  fluoxetine 20 and Ritalin 5 mg TID. Without Ritalin sliding back into cycle of sadness, crying, poor focus.  Started Ritalin back and more motivation and concentration.. Moved appt up. Still struggles with sadness and low interest and energy and low self esteem.  No SI.  Persistent depression and she fights it with diet and exercise and activity.  Feels heavy Asked about retring Wellbutrin Plan: Reduce fluoxetine to 10 mg daily.  Call if any SE.  Add wellbutrin XL 150 mg daily for depression and stop stimulant.  08/24/21 appt noted: It failed.  Couldn't tolerate it for a week bc dry eyes re: wellbutrin. So decided to retry Abilify for mood. 2 mg daily and increased fluoxetine 2 mg daily. More steady and less down and more motivated with Abilify 2 daily.   SE a little foggy and hope for no weight gain. Ritalin a little  drying but asks about retrying dextrostat.  Not taking stimulant currently.  Past Psychiatric Medication Trials: Modafinil SE, methylphenidate tablets,  , Dextrostat, Adderall, Ritalin, Strattera, Deplin, Vyvanse, Evekeo, Concerta 27 SE, bupropion Fluoxetine 20, Lexapro, sertraline 37.5 side effects, venlafaxine, duloxetine, Pristiq, paroxetine, citalopram,  buspirone,  Abilify 2 marked benefit but CO some SE spacy and stopped. Clonazepam sed, Xanax,    Hydroxyzine Last counseling years ago.  Review of Systems:  Review of Systems  Cardiovascular:  Negative for chest pain and palpitations.  Musculoskeletal:  Positive for back pain.  Neurological:  Negative for tremors and weakness.  Psychiatric/Behavioral:  Positive for decreased concentration and depression. Negative for agitation, behavioral problems, confusion, dysphoric mood, hallucinations, self-injury, sleep disturbance and suicidal ideas. The patient is not nervous/anxious and is not hyperactive.    Medications: I have reviewed the patient's current medications.  Current Outpatient Medications  Medication Sig  Dispense Refill   ALPRAZolam (XANAX) 0.5 MG tablet TAKE 1/2 TO 1 TABLET BY MOUTH AT BEDTIME AS NEEDED FOR ANXIETY OR SLEEP 30 tablet 5   b complex vitamins capsule Take 1 capsule by mouth daily.     CALCIUM PO Take by mouth.     FLUoxetine (PROZAC) 10 MG capsule Take 1 capsule (10 mg total) by mouth daily. (Patient taking differently: Take 20 mg by mouth daily.) 90 capsule 0   Omega-3 1000 MG CAPS Take by mouth.     vitamin C (ASCORBIC ACID) 500 MG tablet Take 500 mg by mouth daily.     ARIPiprazole (ABILIFY) 2 MG tablet Take 1 tablet (2 mg total) by mouth daily. 90 tablet 1   Ivermectin (SOOLANTRA) 1 % CREA Apply 1 application topically daily. 30 g 2   methylphenidate (RITALIN) 5 MG tablet Take 1 tablet (5 mg total) by mouth 3 (three) times daily with meals. 90 tablet 0   NONFORMULARY OR COMPOUNDED ITEM 2% metronidazole ( crush tab or powder) in 1% hydrocortisone cream qsad 4 oz 1 each 0   No current facility-administered medications for this visit.    Medication Side Effects: None  Allergies:  Allergies  Allergen Reactions   Biaxin [Clarithromycin]     Upset stomach   Nsaids Rash    Past Medical History:  Diagnosis Date   Arthritis     History reviewed. No pertinent family history.  Social History   Socioeconomic History   Marital status: Divorced    Spouse name: Not on file   Number of children: Not on file   Years of education: Not on file   Highest education level: Not on file  Occupational History   Not on file  Tobacco Use   Smoking status: Never   Smokeless tobacco: Never  Substance and Sexual Activity   Alcohol use: Never   Drug use: Never   Sexual activity: Not on file  Other Topics Concern   Not on file  Social History Narrative   Not on file   Social Determinants of Health   Financial Resource Strain: Not on file  Food Insecurity: Not on file  Transportation Needs: Not on file  Physical Activity: Not on file  Stress: Not on file  Social  Connections: Not on file  Intimate Partner Violence: Not on file    Past Medical History, Surgical history, Social history, and Family history were reviewed and updated as appropriate.   Please see review of systems for further details on the patient's review from today.   Objective:   Physical Exam:  There  were no vitals taken for this visit.  Physical Exam Constitutional:      General: She is not in acute distress.    Appearance: She is well-developed.  Musculoskeletal:        General: No deformity.  Neurological:     Mental Status: She is alert and oriented to person, place, and time.     Motor: No tremor.     Coordination: Coordination normal.     Gait: Gait normal.  Psychiatric:        Attention and Perception: Perception normal. She is attentive.        Mood and Affect: Mood is anxious. Mood is not depressed. Affect is not labile, blunt, angry or inappropriate.        Speech: Speech normal.        Behavior: Behavior normal.        Thought Content: Thought content normal. Thought content is not delusional. Thought content does not include homicidal or suicidal ideation. Thought content does not include suicidal plan.        Cognition and Memory: Cognition normal.        Judgment: Judgment normal.     Comments: Insight and judgment good. No auditory or visual hallucinations. No delusions.  Less depression with Abilify      Lab Review:     Component Value Date/Time   NA 143 09/05/2017 1535   K 3.7 09/05/2017 1535   CL 106 09/05/2017 1535   CO2 29 09/05/2017 1535   GLUCOSE 99 09/05/2017 1535   BUN 14 09/05/2017 1535   CREATININE 0.69 09/05/2017 1535   CALCIUM 9.9 09/05/2017 1535   GFRNONAA >60 09/05/2017 1535   GFRAA >60 09/05/2017 1535       Component Value Date/Time   WBC 7.1 09/05/2017 1535   RBC 4.71 09/05/2017 1535   HGB 14.5 09/05/2017 1535   HCT 42.6 09/05/2017 1535   PLT 206 09/05/2017 1535   MCV 90.4 09/05/2017 1535   MCH 30.8 09/05/2017 1535    MCHC 34.0 09/05/2017 1535   RDW 11.6 09/05/2017 1535   LYMPHSABS 2.1 01/11/2009 1400   MONOABS 0.7 01/11/2009 1400   EOSABS 0.0 01/11/2009 1400   BASOSABS 0.0 01/11/2009 1400    No results found for: POCLITH, LITHIUM   No results found for: PHENYTOIN, PHENOBARB, VALPROATE, CBMZ   .res Assessment: Plan:    Major depressive disorder, recurrent episode, moderate (HCC) - Plan: ARIPiprazole (ABILIFY) 2 MG tablet  Attention deficit hyperactivity disorder (ADHD), predominantly inattentive type - Plan: methylphenidate (RITALIN) 5 MG tablet  Generalized anxiety disorder  Insomnia due to mental condition   Worse than usual depression with clear connection with unresolved grief over divorce until restarted Abilify 2 mg daily.  Rec counseling.   Disc options. Alvester Chou PhD.    Disc letter writing.  She has journals.   Consider alternative Trintellix but it might not be affordable.  Disc med sensitivity, discussed her concerns in general fearfulness around medications.   Continue fluoxetine 20 mg daily.  Call if any SE.  Continue Abilify 2 mg daily bc helped depression.  Discussed potential benefits, risks, and side effects of stimulants with patient to include increased heart rate, palpitations, insomnia, increased anxiety, increased irritability, or decreased appetite.  Instructed patient to contact office if experiencing any significant tolerability issues. Failed to tolerate Concerta 36 and 27 mg.  Has the option of trying the 18 mg. Failed multiple stimulants DT tolerability px.  Probably can't afford liquid stimulant which  is the only option left.  Option retry Dextrostat but she wishes to defer. Continue Ritalin as needed  We discussed the short-term risks associated with benzodiazepines including sedation and increased fall risk among others.  Discussed long-term side effect risk including dependence, potential withdrawal symptoms, and the potential eventual dose-related risk of  dementia.  Disc unusual combo stimulants but is serving good medical purpose.  Still takes Xanax 0.125-0.25 HS  FU 2 mos  Lynder Parents, MD, DFAPA   Please see After Visit Summary for patient specific instructions.  Future Appointments  Date Time Provider [place]  09/15/2021  2:45 PM Lavonna Monarch, MD [facility]    No orders of the defined types were placed in this encounter.     -------------------------------

## 2021-08-27 ENCOUNTER — Telehealth: Payer: Self-pay | Admitting: Psychiatry

## 2021-08-27 ENCOUNTER — Other Ambulatory Visit: Payer: Self-pay | Admitting: Psychiatry

## 2021-08-27 DIAGNOSIS — F900 Attention-deficit hyperactivity disorder, predominantly inattentive type: Secondary | ICD-10-CM

## 2021-08-27 MED ORDER — METHYLPHENIDATE HCL 5 MG PO TABS: 5.0000 mg | ORAL_TABLET | Freq: Three times a day (TID) | ORAL | 0 refills | Status: DC

## 2021-08-27 NOTE — Telephone Encounter (Signed)
Pt lvm at 4:30 pm wanting her ritalin script cancelled at [place] [facility] on [address]

## 2021-09-01 DIAGNOSIS — H538 Other visual disturbances: Secondary | ICD-10-CM | POA: Diagnosis not present

## 2021-09-01 DIAGNOSIS — Z0100 Encounter for examination of eyes and vision without abnormal findings: Secondary | ICD-10-CM | POA: Diagnosis not present

## 2021-09-01 DIAGNOSIS — H35371 Puckering of macula, right eye: Secondary | ICD-10-CM | POA: Diagnosis not present

## 2021-09-01 DIAGNOSIS — H43393 Other vitreous opacities, bilateral: Secondary | ICD-10-CM | POA: Diagnosis not present

## 2021-09-15 ENCOUNTER — Ambulatory Visit (INDEPENDENT_AMBULATORY_CARE_PROVIDER_SITE_OTHER): Payer: Medicare HMO | Admitting: Dermatology

## 2021-09-15 ENCOUNTER — Encounter: Payer: Self-pay | Admitting: Dermatology

## 2021-09-15 DIAGNOSIS — L720 Epidermal cyst: Secondary | ICD-10-CM

## 2021-09-15 DIAGNOSIS — L820 Inflamed seborrheic keratosis: Secondary | ICD-10-CM

## 2021-09-15 DIAGNOSIS — L570 Actinic keratosis: Secondary | ICD-10-CM

## 2021-09-15 DIAGNOSIS — Z1283 Encounter for screening for malignant neoplasm of skin: Secondary | ICD-10-CM

## 2021-09-15 DIAGNOSIS — L821 Other seborrheic keratosis: Secondary | ICD-10-CM | POA: Diagnosis not present

## 2021-09-15 DIAGNOSIS — D1801 Hemangioma of skin and subcutaneous tissue: Secondary | ICD-10-CM

## 2021-09-15 DIAGNOSIS — D485 Neoplasm of uncertain behavior of skin: Secondary | ICD-10-CM

## 2021-09-15 NOTE — Patient Instructions (Signed)

## 2021-09-21 ENCOUNTER — Telehealth: Payer: Self-pay | Admitting: Psychiatry

## 2021-09-21 NOTE — Telephone Encounter (Signed)
This was just filled 09/02/21

## 2021-09-21 NOTE — Telephone Encounter (Signed)
Pt lvm at 3:03 pm and said that she needs a refill on her ritalin 5 mg to [facility]  at [address]

## 2021-09-23 ENCOUNTER — Telehealth: Payer: Self-pay | Admitting: Psychiatry

## 2021-09-23 NOTE — Telephone Encounter (Unsigned)
Pt called back about status of meds.  I informed her that it was refilled on 6/1.  She has called back at 11:46a.  She said [facility] and that is the one she is taking.  She said she had a similar script sent to [facility] on 6/1, but she is not taking that one because it makes her eyes really red.  Pls call her back because she is still saying she wants the refill based on the [facility] she [facility].  Next appt . 8/30

## 2021-09-24 ENCOUNTER — Telehealth: Payer: Self-pay

## 2021-09-24 ENCOUNTER — Other Ambulatory Visit: Payer: Self-pay

## 2021-09-24 DIAGNOSIS — F900 Attention-deficit hyperactivity disorder, predominantly inattentive type: Secondary | ICD-10-CM

## 2021-09-24 MED ORDER — METHYLPHENIDATE HCL 5 MG PO TABS: 5.0000 mg | ORAL_TABLET | Freq: Three times a day (TID) | ORAL | 0 refills | Status: DC

## 2021-09-24 NOTE — Telephone Encounter (Signed)
Pended.

## 2021-10-09 ENCOUNTER — Encounter: Payer: Self-pay | Admitting: Dermatology

## 2021-10-09 NOTE — Progress Notes (Signed)
   Follow-Up Visit   Subjective  Alyssa Vaughn is a 71 y.o. female who presents for the following: Annual Exam (Top left shoulder possible isk x months with itch).  Annual skin check, most concern is change in spot on upper left back Location:  Duration:  Quality:  Associated Signs/Symptoms: Modifying Factors:  Severity:  Timing: Context:   Objective  Well appearing patient in no apparent distress; mood and affect are within normal limits. Full body skin exam.  No atypical pigmented lesions (all checked with dermoscopy)  Left Abdomen (side) - Upper, Left Upper Back, Right Abdomen (side) - Lower Multiple noninflamed 3 to 8 mm textured brown papules  Right Ear, Right Temple Half millimeter upper dermal white papules  Left Abdomen (side) - Upper 3 mm smooth red dermal papule  Mid Tip of Nose History of some pink roughness on nasal bulb, smooth today  Left Upper Back 1.4 cm inflamed brown crust         A full examination was performed including scalp, head, eyes, ears, nose, lips, neck, chest, axillae, abdomen, back, buttocks, bilateral upper extremities, bilateral lower extremities, hands, feet, fingers, toes, fingernails, and toenails. All findings within normal limits unless otherwise noted below.  Areas beneath undergarments not fully examined   Assessment & Plan    Seborrheic keratosis (3) Left Abdomen (side) - Upper; Right Abdomen (side) - Lower; Left Upper Back  Leave if stable  Screening for malignant neoplasm of skin  Yearly skin exams.   [place] (2) Right Ear; Right Temple  Patient may choose to have these removed in future.  Hemangioma of skin Left Abdomen (side) - Upper  No intervention necessary  Solar keratosis Mid Tip of Nose  Return if recurs  Neoplasm of uncertain behavior of skin Left Upper Back  Skin / nail biopsy Type of biopsy: tangential   Informed consent: discussed and consent obtained   Timeout: patient name, date of  birth, surgical site, and procedure verified   Anesthesia: the lesion was anesthetized in a standard fashion   Anesthetic:  1% lidocaine w/ epinephrine 1-100,000 local infiltration Instrument used: flexible razor blade   Hemostasis achieved with: ferric subsulfate   Outcome: patient tolerated procedure well   Post-procedure details: wound care instructions given    Specimen 1 - Surgical pathology Differential Diagnosis: SK  Check Margins: No      I, Lavonna Monarch, MD, have reviewed all documentation for this visit.  The documentation on 10/09/21 for the exam, diagnosis, procedures, and orders are all accurate and complete.

## 2021-10-14 DIAGNOSIS — R110 Nausea: Secondary | ICD-10-CM | POA: Diagnosis not present

## 2021-10-14 DIAGNOSIS — R5383 Other fatigue: Secondary | ICD-10-CM | POA: Diagnosis not present

## 2021-10-14 DIAGNOSIS — R197 Diarrhea, unspecified: Secondary | ICD-10-CM | POA: Diagnosis not present

## 2021-10-15 DIAGNOSIS — R110 Nausea: Secondary | ICD-10-CM | POA: Diagnosis not present

## 2021-10-15 DIAGNOSIS — R109 Unspecified abdominal pain: Secondary | ICD-10-CM | POA: Diagnosis not present

## 2021-10-18 DIAGNOSIS — R1013 Epigastric pain: Secondary | ICD-10-CM | POA: Diagnosis not present

## 2021-10-19 DIAGNOSIS — R197 Diarrhea, unspecified: Secondary | ICD-10-CM | POA: Diagnosis not present

## 2021-10-22 ENCOUNTER — Other Ambulatory Visit: Payer: Self-pay | Admitting: Family Medicine

## 2021-10-22 DIAGNOSIS — R1013 Epigastric pain: Secondary | ICD-10-CM

## 2021-10-26 ENCOUNTER — Telehealth: Payer: Self-pay | Admitting: Psychiatry

## 2021-10-26 ENCOUNTER — Other Ambulatory Visit: Payer: Self-pay

## 2021-10-26 DIAGNOSIS — F900 Attention-deficit hyperactivity disorder, predominantly inattentive type: Secondary | ICD-10-CM

## 2021-10-26 NOTE — Telephone Encounter (Signed)
Last filled 6/28, due 7/26

## 2021-10-26 NOTE — Telephone Encounter (Signed)
Patient called needing refill on Methylphenidate 5 mg called to:  [id number] [place], [place] [address] Phone:  [phone] Fax:  [phone]

## 2021-10-27 ENCOUNTER — Other Ambulatory Visit: Payer: Self-pay

## 2021-10-27 ENCOUNTER — Other Ambulatory Visit: Payer: BC Managed Care – PPO

## 2021-10-27 DIAGNOSIS — F900 Attention-deficit hyperactivity disorder, predominantly inattentive type: Secondary | ICD-10-CM

## 2021-10-27 MED ORDER — METHYLPHENIDATE HCL 5 MG PO TABS
5.0000 mg | ORAL_TABLET | Freq: Three times a day (TID) | ORAL | 0 refills | Status: DC
Start: 2021-10-27 — End: 2021-12-01

## 2021-10-27 NOTE — Telephone Encounter (Signed)
Pended.

## 2021-10-28 ENCOUNTER — Other Ambulatory Visit: Payer: BC Managed Care – PPO

## 2021-10-28 ENCOUNTER — Ambulatory Visit
Admission: RE | Admit: 2021-10-28 | Discharge: 2021-10-28 | Disposition: A | Discharge status: Home / Self Care | Payer: BC Managed Care – PPO | Source: Ambulatory Visit | Attending: Family Medicine | Admitting: Family Medicine

## 2021-10-28 DIAGNOSIS — R1013 Epigastric pain: Secondary | ICD-10-CM

## 2021-10-28 DIAGNOSIS — K8020 Calculus of gallbladder without cholecystitis without obstruction: Secondary | ICD-10-CM | POA: Diagnosis not present

## 2021-11-03 DIAGNOSIS — K8020 Calculus of gallbladder without cholecystitis without obstruction: Secondary | ICD-10-CM | POA: Diagnosis not present

## 2021-11-05 DIAGNOSIS — R399 Unspecified symptoms and signs involving the genitourinary system: Secondary | ICD-10-CM | POA: Diagnosis not present

## 2021-11-05 DIAGNOSIS — R1031 Right lower quadrant pain: Secondary | ICD-10-CM | POA: Diagnosis not present

## 2021-11-05 DIAGNOSIS — R350 Frequency of micturition: Secondary | ICD-10-CM | POA: Diagnosis not present

## 2021-11-07 DIAGNOSIS — K5900 Constipation, unspecified: Secondary | ICD-10-CM | POA: Diagnosis not present

## 2021-11-07 DIAGNOSIS — R109 Unspecified abdominal pain: Secondary | ICD-10-CM | POA: Diagnosis not present

## 2021-11-07 DIAGNOSIS — R300 Dysuria: Secondary | ICD-10-CM | POA: Diagnosis not present

## 2021-11-10 ENCOUNTER — Other Ambulatory Visit: Payer: Self-pay | Admitting: Psychiatry

## 2021-11-10 DIAGNOSIS — F331 Major depressive disorder, recurrent, moderate: Secondary | ICD-10-CM

## 2021-11-10 DIAGNOSIS — F411 Generalized anxiety disorder: Secondary | ICD-10-CM

## 2021-11-15 ENCOUNTER — Other Ambulatory Visit: Payer: Self-pay | Admitting: Physician Assistant

## 2021-11-15 DIAGNOSIS — K5900 Constipation, unspecified: Secondary | ICD-10-CM | POA: Diagnosis not present

## 2021-11-15 DIAGNOSIS — R1031 Right lower quadrant pain: Secondary | ICD-10-CM

## 2021-11-15 DIAGNOSIS — R319 Hematuria, unspecified: Secondary | ICD-10-CM | POA: Diagnosis not present

## 2021-11-15 DIAGNOSIS — R10813 Right lower quadrant abdominal tenderness: Secondary | ICD-10-CM | POA: Diagnosis not present

## 2021-11-15 DIAGNOSIS — R109 Unspecified abdominal pain: Secondary | ICD-10-CM

## 2021-11-17 DIAGNOSIS — R1031 Right lower quadrant pain: Secondary | ICD-10-CM | POA: Diagnosis not present

## 2021-11-25 ENCOUNTER — Other Ambulatory Visit: Payer: Self-pay | Admitting: Psychiatry

## 2021-11-25 DIAGNOSIS — F331 Major depressive disorder, recurrent, moderate: Secondary | ICD-10-CM

## 2021-11-25 DIAGNOSIS — F411 Generalized anxiety disorder: Secondary | ICD-10-CM

## 2021-11-25 NOTE — Telephone Encounter (Signed)
Called patient to verify dose. She said last refill from 5/23 was for 20 mg. 07/28/21 note said reduced to 10 mg.

## 2021-11-28 DIAGNOSIS — J029 Acute pharyngitis, unspecified: Secondary | ICD-10-CM | POA: Diagnosis not present

## 2021-11-28 DIAGNOSIS — R5383 Other fatigue: Secondary | ICD-10-CM | POA: Diagnosis not present

## 2021-11-28 DIAGNOSIS — Z03818 Encounter for observation for suspected exposure to other biological agents ruled out: Secondary | ICD-10-CM | POA: Diagnosis not present

## 2021-11-28 DIAGNOSIS — Z20822 Contact with and (suspected) exposure to covid-19: Secondary | ICD-10-CM | POA: Diagnosis not present

## 2021-11-30 ENCOUNTER — Other Ambulatory Visit: Payer: Self-pay | Admitting: Psychiatry

## 2021-11-30 DIAGNOSIS — F5105 Insomnia due to other mental disorder: Secondary | ICD-10-CM

## 2021-12-01 ENCOUNTER — Ambulatory Visit (INDEPENDENT_AMBULATORY_CARE_PROVIDER_SITE_OTHER): Payer: Medicare HMO | Admitting: Psychiatry

## 2021-12-01 ENCOUNTER — Encounter: Payer: Self-pay | Admitting: Psychiatry

## 2021-12-01 DIAGNOSIS — F900 Attention-deficit hyperactivity disorder, predominantly inattentive type: Secondary | ICD-10-CM

## 2021-12-01 DIAGNOSIS — F5105 Insomnia due to other mental disorder: Secondary | ICD-10-CM

## 2021-12-01 DIAGNOSIS — R69 Illness, unspecified: Secondary | ICD-10-CM | POA: Diagnosis not present

## 2021-12-01 DIAGNOSIS — F331 Major depressive disorder, recurrent, moderate: Secondary | ICD-10-CM | POA: Diagnosis not present

## 2021-12-01 DIAGNOSIS — F411 Generalized anxiety disorder: Secondary | ICD-10-CM | POA: Diagnosis not present

## 2021-12-01 MED ORDER — METHYLPHENIDATE HCL 5 MG PO TABS: 5.0000 mg | ORAL_TABLET | Freq: Three times a day (TID) | ORAL | 0 refills | Status: DC

## 2021-12-01 MED ORDER — FLUOXETINE HCL 20 MG PO CAPS: 20.0000 mg | ORAL_CAPSULE | Freq: Every day | ORAL | 1 refills | Status: DC

## 2021-12-01 MED ORDER — ALPRAZOLAM 0.5 MG PO TABS: ORAL_TABLET | ORAL | 5 refills | Status: DC

## 2021-12-01 NOTE — Progress Notes (Signed)
Alyssa Vaughn [id number] [birth year] 71 y.o.  Virtual Visit via Telephone Note  I connected with pt by telephone and verified that I am speaking with the correct person using two identifiers.   I discussed the limitations, risks, security and privacy concerns of performing an evaluation and management service by telephone and the availability of in person appointments. I also discussed with the patient that there may be a patient responsible charge related to this service. The patient expressed understanding and agreed to proceed.  I discussed the assessment and treatment plan with the patient. The patient was provided an opportunity to ask questions and all were answered. The patient agreed with the plan and demonstrated an understanding of the instructions.   The patient was advised to call back or seek an in-person evaluation if the symptoms worsen or if the condition fails to improve as anticipated.  I provided 30 minutes of non-face-to-face time during this encounter. The call started at 900 and ended at 930. The patient was located at home and the provider was located office.   Subjective:   Patient ID:  Alyssa Vaughn is a 71 y.o. (DOB January 15, 1951) female.  Chief Complaint:  Chief Complaint  Patient presents with   ADHD   Depression   Anxiety   Follow-up    Anxiety Symptoms include decreased concentration. Patient reports no chest pain, confusion, nervous/anxious behavior, palpitations or suicidal ideas.    Depression        Associated symptoms include decreased concentration.  Associated symptoms include no suicidal ideas.  Past medical history includes anxiety.    DRAYA FELKER presents to the office today for follow-up of increased fluoxetine for worsening depression without reason.    seen October 2020.  No meds were changed.  Fluoxetine 20 mg was continued.  07/09/2019 appointment the following is noted: Wants to try Quillivent bc easily confused, distracted  and unfocused as noted. Good overall.  Fluoxetine helps anxiety and tears. Only taking Xanax 1/2 tablet at night.   Still ADD and head does not feel awake.  Hard to concentrate with groups.  But also doesn't feel quite awake and alert usually.   Feel like my brain won't wake up.  Tried modafinil 50 mg but nervous so much couldn't evaluate effect on ADD.  Has tried lots of ADD meds and didn't tolerate them. Grand kids in best.  [name] murdered but he hasn't been in the picture.  Pleased she's been active.  Not generally self-motivated and pleased she's stayed busy. 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  I just have to let it go.   Can't focus on [place].  She periodically struggles with sadness over the brokenness in the relationship.  Recognizes she craves his attention.  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  Don't need clonazepam at any other time.  Patient denies difficulty with sleep initiation or maintenance. 7-8 hours.  Denies appetite disturbance.  Patient reports that energy and motivation have been good.  Patient denies any suicidal ideation.  No longer depressed usually and anxious but episodic.  Klonopin good once daily for anxiety.  Xanax for sleep ok. Plan: Start Quillivant 1 ml daily and may increase by 1 ml weekly as tolerated up to about 3-4 ml  09/03/2019 phone call that the Laurence Ferrari was too expensive and wanted a prescription for Ritalin 5 mg which was sent.  09/09/2019 appointment with the following noted:  Ritalin helpful.  takiing up to BID and it makes a big difference in focus and productivity. Tolerating it well.   Taking a little alprazolam at night usually about 1/4 of 0.5 mg night.y.  Not taking clonazepam. Asks about taper BZ. Overall feels the best she's been.  After years of divorced she feels more free. Less fear spiritually now. No med changes  03/10/20 appt with following  noted: Not taking stimulants. Taking 0.125 mg alprazolam HS.  Makes her sleepy a little.  Not sure if she needs it.  Wonders about stopping .   Overall good and bad.  Up and Vaughn mood with a lot of back problems which she's working on.  Sometimes unmanageable. Low days fight with herself, tired and uninterested.  Stress and obsess over [place]. Divorced 2008 and still not over it.  Still no life for herself. Don't get involved with things.  Only looks forward to popcorn and going to bed early.  Everything is drudgery and stay unmotivated. These sx come and go.  It's always there in the background but waxes and wanes.   Working on it. Plan no changes and continue fluoxetine and Ritalin.  06/02/2020 appointment with following noted: Not taking Ritalin.  Continued fluoxetine.   Tried to address her issues with [name].  Other stuff still there with cry9ing and hard to focus and get through the day.  Weary of trying meds.  Trying to work on herself with self help.  Tried diet and exercise without change in sx.  Fear of going out witiut a reason.  Feels stuck. Last filled Ritalin Augst 2021. Still depressed.  Struggles to get things done. Recent normal physical.  Plan:  Potentiate Abilify 2 mg tablet 1/2 daily for 2 weeks, then 1 tablet for sig depression  07/21/2020 appt noted: Good with aripiprazole it helped a lot noticeably, smooth Taking Abilify 2 mg daily. Got rid of heavy feeling of depresssion.  No crying.  Easier to do things and go places No SE with meds. Restarted Ritalin 5 mg TID too low.  Wants ER. Needs equivalent of 10 TID.  Helps the confusion of the day and improves focus and completion.  Good combo with Abilify. Plan:  Switch to LA Concerta 36 mg AM.  Call if too much.  09/01/2020 appointment noted: She called a couple times since being here and found both the 36 mg size and the 27 mg size of Concerta to be "too much" including symptoms of tension and anxiety and wanted to switch  back to the Ritalin 5 mg 3 times daily.  She was offered the option of going to the lowest dose of Concerta 18 mg but declined. Ritalin 5 TID not very effective.  Would like to try lower dose of Concerta. Mood still very good and unusual for me.  Benefit Abilify.  Working on herself.   Plan for depression recommend potentiation with Abilify up to 2 mg daily.  Sleep is good.    03/03/2021 appointment with the following noted: Not taking Ritalin DT SE and up/Vaughn feelings from it.  It is helpful but not necessary. Saw benefit with Abilify in the beginning but didn't feel like herself.  Helped the depression and easier to go out and do things. Took the Abilify for 3-4 mos.  , Overall doing OK.  Better situation in the family with good grandparenting with [name] and does things with [place] socially and it helped.  Better sense of focus and direction for her life  and less demanding of him.  Good relationship with [name] and the family.  Settling more into retirement.  Anxiety manageable. Exercise more consistent.  07/28/21 appt noted: Taking fluoxetine 20 and Ritalin 5 mg TID. Without Ritalin sliding back into cycle of sadness, crying, poor focus.  Started Ritalin back and more motivation and concentration.. Moved appt up. Still struggles with sadness and low interest and energy and low self esteem.  No SI.  Persistent depression and she fights it with diet and exercise and activity.  Feels heavy Asked about retring Wellbutrin Plan: Reduce fluoxetine to 10 mg daily.  Call if any SE.  Add wellbutrin XL 150 mg daily for depression and stop stimulant.  08/24/21 appt noted: It failed.  Couldn't tolerate it for a week bc dry eyes re: wellbutrin. So decided to retry Abilify for mood. 2 mg daily and increased fluoxetine 20 mg daily. More steady and less Vaughn and more motivated with Abilify 2 daily.   SE a little foggy and hope for no weight gain. Ritalin a little drying but asks about retrying  dextrostat.  Not taking stimulant currently.  +12/01/21 appt noted: Arnette Norris has Covid and she was exposed. Has continued fluoxetine 20 mg daily without Abilify.  Stopped it DT tremor R hand. Added Ritalin 5 mg BID is usual.  Seems adequate duration.  Leveled things out with distraction, attn is much better.  Able to calm Vaughn and be present.  Tolerating it fine so far.  Using the generic from [facility] and tolerates it better than others she takes.  Yellow and the white one have been tolerated.   Mood is good  though initially worse off Abilify.  No crying now.  Mood smoother on Abilify.   Past Psychiatric Medication Trials: Modafinil SE, methylphenidate tablets,  , Dextrostat, Adderall, Ritalin, Strattera, Deplin, Vyvanse, Evekeo, Concerta 27 SE, Ritalin 5 mg BID tolerated mfg [employer] bupropion Fluoxetine 20, Lexapro, sertraline 37.5 side effects, venlafaxine, duloxetine, Pristiq, paroxetine, citalopram,  buspirone,  Abilify 2 marked benefit but CO some SE spacy and stopped. Clonazepam sed, Xanax,    Hydroxyzine Last counseling years ago.  Review of Systems:  Review of Systems  Cardiovascular:  Negative for chest pain and palpitations.  Musculoskeletal:  Positive for back pain.  Neurological:  Negative for tremors.  Psychiatric/Behavioral:  Positive for decreased concentration. Negative for agitation, behavioral problems, confusion, dysphoric mood, hallucinations, self-injury, sleep disturbance and suicidal ideas. The patient is not nervous/anxious and is not hyperactive.     Medications: I have reviewed the patient's current medications.  Current Outpatient Medications  Medication Sig Dispense Refill   b complex vitamins capsule Take 1 capsule by mouth daily.     CALCIUM PO Take by mouth.     Ivermectin (SOOLANTRA) 1 % CREA Apply 1 application topically daily. 30 g 2   [START ON 12/29/2021] methylphenidate (RITALIN) 5 MG tablet Take 1 tablet (5 mg total) by mouth 3 (three) times daily  with meals. 90 tablet 0   [START ON 01/26/2022] methylphenidate (RITALIN) 5 MG tablet Take 1 tablet (5 mg total) by mouth 3 (three) times daily with meals. 90 tablet 0   NONFORMULARY OR COMPOUNDED ITEM 2% metronidazole ( crush tab or powder) in 1% hydrocortisone cream qsad 4 oz 1 each 0   Omega-3 1000 MG CAPS Take by mouth.     vitamin C (ASCORBIC ACID) 500 MG tablet Take 500 mg by mouth daily.     ALPRAZolam (XANAX) 0.5 MG tablet TAKE 1/2 TO  1 TABLET BY MOUTH AT BEDTIME AS NEEDED FOR ANXIETY OR SLEEP 30 tablet 5   FLUoxetine (PROZAC) 20 MG capsule Take 1 capsule (20 mg total) by mouth daily. 90 capsule 1   methylphenidate (RITALIN) 5 MG tablet Take 1 tablet (5 mg total) by mouth 3 (three) times daily with meals. 90 tablet 0   No current facility-administered medications for this visit.    Medication Side Effects: None  Allergies:  Allergies  Allergen Reactions   Biaxin [Clarithromycin]     Upset stomach   Nsaids Rash    Past Medical History:  Diagnosis Date   Arthritis     History reviewed. No pertinent family history.  Social History   Socioeconomic History   Marital status: Divorced    Spouse name: Not on file   Number of children: Not on file   Years of education: Not on file   Highest education level: Not on file  Occupational History   Not on file  Tobacco Use   Smoking status: Never   Smokeless tobacco: Never  Vaping Use   Vaping Use: Never used  Substance and Sexual Activity   Alcohol use: Never   Drug use: Never   Sexual activity: Not on file  Other Topics Concern   Not on file  Social History Narrative   Not on file   Social Determinants of Health   Financial Resource Strain: Not on file  Food Insecurity: Not on file  Transportation Needs: Not on file  Physical Activity: Not on file  Stress: Not on file  Social Connections: Not on file  Intimate Partner Violence: Not on file    Past Medical History, Surgical history, Social history, and  Family history were reviewed and updated as appropriate.   Please see review of systems for further details on the patient's review from today.   Objective:   Physical Exam:  There were no vitals taken for this visit.  Physical Exam Constitutional:      General: She is not in acute distress.    Appearance: She is well-developed.  Musculoskeletal:        General: No deformity.  Neurological:     Mental Status: She is alert and oriented to person, place, and time.     Motor: No tremor.     Coordination: Coordination normal.     Gait: Gait normal.  Psychiatric:        Attention and Perception: Perception normal. She is attentive.        Mood and Affect: Mood is anxious. Mood is not depressed. Affect is not labile, blunt, angry or inappropriate.        Speech: Speech normal.        Behavior: Behavior normal.        Thought Content: Thought content normal. Thought content is not delusional. Thought content does not include homicidal or suicidal ideation. Thought content does not include suicidal plan.        Cognition and Memory: Cognition normal.        Judgment: Judgment normal.     Comments: Insight and judgment good. No auditory or visual hallucinations. No delusions.  Less depression with Abilify but has done ok without it.       Lab Review:     Component Value Date/Time   NA 143 09/05/2017 1535   K 3.7 09/05/2017 1535   CL 106 09/05/2017 1535   CO2 29 09/05/2017 1535   GLUCOSE 99 09/05/2017 1535   BUN 14  09/05/2017 1535   CREATININE 0.69 09/05/2017 1535   CALCIUM 9.9 09/05/2017 1535   GFRNONAA >60 09/05/2017 1535   GFRAA >60 09/05/2017 1535       Component Value Date/Time   WBC 7.1 09/05/2017 1535   RBC 4.71 09/05/2017 1535   HGB 14.5 09/05/2017 1535   HCT 42.6 09/05/2017 1535   PLT 206 09/05/2017 1535   MCV 90.4 09/05/2017 1535   MCH 30.8 09/05/2017 1535   MCHC 34.0 09/05/2017 1535   RDW 11.6 09/05/2017 1535   LYMPHSABS 2.1 01/11/2009 1400   MONOABS  0.7 01/11/2009 1400   EOSABS 0.0 01/11/2009 1400   BASOSABS 0.0 01/11/2009 1400    No results found for: "POCLITH", "LITHIUM"   No results found for: "PHENYTOIN", "PHENOBARB", "VALPROATE", "CBMZ"   .res Assessment: Plan:    Attention deficit hyperactivity disorder (ADHD), predominantly inattentive type - Plan: methylphenidate (RITALIN) 5 MG tablet, methylphenidate (RITALIN) 5 MG tablet, methylphenidate (RITALIN) 5 MG tablet  Major depressive disorder, recurrent episode, moderate (HCC) - Plan: FLUoxetine (PROZAC) 20 MG capsule  Generalized anxiety disorder - Plan: FLUoxetine (PROZAC) 20 MG capsule  Insomnia due to mental condition - Plan: ALPRAZolam (XANAX) 0.5 MG tablet   Worse than usual depression with clear connection with unresolved grief over divorce until restarted Abilify 2 mg daily but stopped as noted and is ok right now..  Rec counseling.   Disc options. Alvester Chou PhD.   She never did this. Disc letter writing.  She has journals.   Consider alternative Trintellix but it might not be affordable.  Disc med sensitivity, discussed her concerns in general fearfulness around medications.   Continue fluoxetine 20 mg daily.  Call if any SE.  hold Abilify 2 mg daily helped depression but tremor.  Consider Rexulti if needed Continue Ritalin 5 mg BID-TID  Discussed potential benefits, risks, and side effects of stimulants with patient to include increased heart rate, palpitations, insomnia, increased anxiety, increased irritability, or decreased appetite.  Instructed patient to contact office if experiencing any significant tolerability issues. Failed to tolerate Concerta 36 and 27 mg.  Has the option of trying the 18 mg. Failed multiple stimulants DT tolerability px.  Probably can't afford liquid stimulant which is the only option left.  Option retry Dextrostat but she wishes to defer. Continue Ritalin   We discussed the short-term risks associated with benzodiazepines including  sedation and increased fall risk among others.  Discussed long-term side effect risk including dependence, potential withdrawal symptoms, and the potential eventual dose-related risk of dementia.  Disc unusual combo stimulants but is serving good medical purpose.  Still takes Xanax 0.125-0.25 HS  FU 3-4 mos  Lynder Parents, MD, DFAPA   Please see After Visit Summary for patient specific instructions.  Future Appointments  Date Time Provider [place]  12/02/2021  4:30 PM [facility] 1 [facility]    No orders of the defined types were placed in this encounter.     -------------------------------

## 2021-12-02 ENCOUNTER — Ambulatory Visit
Admission: RE | Admit: 2021-12-02 | Discharge: 2021-12-02 | Disposition: A | Discharge status: Home / Self Care | Payer: BC Managed Care – PPO | Source: Ambulatory Visit | Attending: Physician Assistant | Admitting: Physician Assistant

## 2021-12-02 DIAGNOSIS — K8020 Calculus of gallbladder without cholecystitis without obstruction: Secondary | ICD-10-CM | POA: Diagnosis not present

## 2021-12-02 DIAGNOSIS — R1031 Right lower quadrant pain: Secondary | ICD-10-CM

## 2021-12-02 DIAGNOSIS — R109 Unspecified abdominal pain: Secondary | ICD-10-CM

## 2021-12-09 DIAGNOSIS — H524 Presbyopia: Secondary | ICD-10-CM | POA: Diagnosis not present

## 2021-12-15 DIAGNOSIS — Z1211 Encounter for screening for malignant neoplasm of colon: Secondary | ICD-10-CM | POA: Diagnosis not present

## 2021-12-15 DIAGNOSIS — K8020 Calculus of gallbladder without cholecystitis without obstruction: Secondary | ICD-10-CM | POA: Diagnosis not present

## 2021-12-15 DIAGNOSIS — K5900 Constipation, unspecified: Secondary | ICD-10-CM | POA: Diagnosis not present

## 2021-12-28 ENCOUNTER — Ambulatory Visit (INDEPENDENT_AMBULATORY_CARE_PROVIDER_SITE_OTHER): Payer: Medicare HMO | Admitting: Sports Medicine

## 2021-12-28 VITALS — BP 122/78 | Ht 62.0 in | Wt 105.0 lb

## 2021-12-28 DIAGNOSIS — M542 Cervicalgia: Secondary | ICD-10-CM | POA: Diagnosis not present

## 2021-12-28 MED ORDER — PREDNISONE 10 MG PO TABS: ORAL_TABLET | ORAL | 0 refills | Status: DC

## 2021-12-28 NOTE — Progress Notes (Unsigned)
PCP: Jonathon Jordan, MD  Subjective:   HPI: Patient is a 71 y.o. female here for right neck and shoulder pain.  Patient's symptoms started 1 month ago with right shoulder pain.  She had no inciting injury or trauma.  She then noticed right-sided neck pain, and burning/tingling in bilateral upper extremities.  While she does have tingling in bilateral arms, the right is significantly worse than the left.  She tried ice followed by heat for the right shoulder without relief.  She tried gentle traction at yoga class which she did find helpful.  She denies weakness in her arms.  Has been lifting her 20-year-old grandchild recently but no other changes to her daily activity.  Of note patient has a history of two-level cervical fusion in 2017.  Reports she has done well since that time with no neck issues.  Past Medical History:  Diagnosis Date   Arthritis     Current Outpatient Medications on File Prior to Visit  Medication Sig Dispense Refill   ALPRAZolam (XANAX) 0.5 MG tablet TAKE 1/2 TO 1 TABLET BY MOUTH AT BEDTIME AS NEEDED FOR ANXIETY OR SLEEP 30 tablet 5   b complex vitamins capsule Take 1 capsule by mouth daily.     CALCIUM PO Take by mouth.     FLUoxetine (PROZAC) 20 MG capsule Take 1 capsule (20 mg total) by mouth daily. 90 capsule 1   Ivermectin (SOOLANTRA) 1 % CREA Apply 1 application topically daily. 30 g 2   methylphenidate (RITALIN) 5 MG tablet Take 1 tablet (5 mg total) by mouth 3 (three) times daily with meals. 90 tablet 0   [START ON 12/29/2021] methylphenidate (RITALIN) 5 MG tablet Take 1 tablet (5 mg total) by mouth 3 (three) times daily with meals. 90 tablet 0   [START ON 01/26/2022] methylphenidate (RITALIN) 5 MG tablet Take 1 tablet (5 mg total) by mouth 3 (three) times daily with meals. 90 tablet 0   NONFORMULARY OR COMPOUNDED ITEM 2% metronidazole ( crush tab or powder) in 1% hydrocortisone cream qsad 4 oz 1 each 0   Omega-3 1000 MG CAPS Take by mouth.      vitamin C (ASCORBIC ACID) 500 MG tablet Take 500 mg by mouth daily.     No current facility-administered medications on file prior to visit.    Past Surgical History:  Procedure Laterality Date   APPENDECTOMY     HERNIA REPAIR      Allergies  Allergen Reactions   Biaxin [Clarithromycin]     Upset stomach   Nsaids Rash    BP 122/78   Ht '5\' 2"'$  (1.575 m)   Wt 105 lb (47.6 kg)   BMI 19.20 kg/m       No data to display              No data to display              Objective:  Physical Exam:  Gen: NAD, comfortable in exam room Neck: Surgical scar noted, inspection otherwise unremarkable.  No midline TTP.  Mild TTP over right lateral neck in the area of the trapezius.  Good ROM with flexion, extension, rotation, and lateral bending.  Right shoulder: Inspection is normal without obvious deformity or atrophy.  There is moderate TTP over AC joint and trapezius muscle.  Full range of motion.  5/5 strength throughout although she has pain with resisted external rotation.  Mild pain with crossarm test.  No painful arc or drop arm  sign.  Negative empty can. 5/5 grip strength bilaterally. Upper extremity DTRs intact.   Assessment & Plan:  1. Right upper extremity pain: Suspect patient has 2 separate issues. One of these is burning/tingling in bilateral (R>L) upper extremities, which is almost certainly related to neck pathology, especially given her history of prior fusion. We will obtain x-rays of the cervical spine to further evaluate. The second issue is right shoulder pain which may be AC joint arthritis versus trapezius muscle strain. Less likely rotator cuff etiology based on exam. Return for complete shoulder ultrasound.  In the meantime she will trial prednisone Dosepak to address current pain/inflammation in both shoulder and neck.  Alcus Dad, MD PGY-3, [facility]  Patient seen and evaluated with the resident.  I agree with the above plan of care.   I believe the burning in her arms is originating from her cervical spine.  She is status post ACDF in 2017, C5-C7.  Would like to get an x-ray of her cervical spine she will follow-up with me in a week or 2 for complete right shoulder ultrasound as I do believe some of her symptoms are originating possibly from her [facility] joint or rotator cuff.  In the meantime, we will try a 6-day Sterapred Dosepak see if we can calm down her inflammation.

## 2021-12-29 ENCOUNTER — Ambulatory Visit
Admission: RE | Admit: 2021-12-29 | Discharge: 2021-12-29 | Disposition: A | Discharge status: Home / Self Care | Payer: BC Managed Care – PPO | Source: Ambulatory Visit | Attending: Sports Medicine | Admitting: Sports Medicine

## 2021-12-29 DIAGNOSIS — M542 Cervicalgia: Secondary | ICD-10-CM | POA: Diagnosis not present

## 2022-01-05 ENCOUNTER — Telehealth: Payer: Self-pay | Admitting: Psychiatry

## 2022-01-06 ENCOUNTER — Ambulatory Visit: Payer: Self-pay

## 2022-01-06 ENCOUNTER — Ambulatory Visit (INDEPENDENT_AMBULATORY_CARE_PROVIDER_SITE_OTHER): Payer: Medicare HMO | Admitting: Sports Medicine

## 2022-01-06 ENCOUNTER — Other Ambulatory Visit: Payer: BC Managed Care – PPO | Admitting: Sports Medicine

## 2022-01-06 VITALS — BP 133/67 | Ht 62.0 in

## 2022-01-06 DIAGNOSIS — M25511 Pain in right shoulder: Secondary | ICD-10-CM

## 2022-01-06 NOTE — Progress Notes (Signed)
Alyssa Vaughn - 71 y.o. female MRN [id number]  Date of birth: [birth year]    CHIEF COMPLAINT:   Right shoulder pain    SUBJECTIVE:   HPI:  Pleasant 71 year old female comes to clinic for complete diagnostic ultrasound of right shoulder.  Pain is been present for about 1 month.  It is located on the lateral aspect of the shoulder.  She has not tried any medicines.  She does take yoga class and stretches the shoulder but has not tried any targeted therapies.  She also continues to report bilateral upper extremity numbness and tingling in the fingers.  She has a history of ACDF in 2017 with Dr. Chaney Born.  X-rays from last visit showed stable postoperative changes.  No acute osseous abnormalities.  Mild disc space height loss at C4-C5.  ROS:     See HPI  PERTINENT  PMH / PSH FH / / SH:  Past Medical, Surgical, Social, and Family History Reviewed & Updated in the EMR.  Pertinent findings include:  none  OBJECTIVE: BP 133/67   Ht '5\' 2"'$  (1.575 m)   BMI 19.20 kg/m   Physical Exam:  Vital signs are reviewed.  GEN: Alert and oriented, NAD Pulm: Breathing unlabored PSY: normal mood, congruent affect  MSK: Right shoulder -nontender to palpation over the biceps tendon or AC joint.  Full range of motion.  5/5 strength throughout although some pain with resisted external rotation.  Negative empty can test.  Negative Hawkins test.  COMPLETE DIAGNOSTIC ULTRASOUND: Shoulder, Right  Diagnostic complete ultrasound imaging obtained of patient's right shoulder.  - No obvious evidence of bony deformity or osteophyte development appreciated.  - Long head of the biceps tendon: No evidence of tendon thickening, calcification, subluxation, or tearing in short or long axis views. No edema or bullseye sign.  - Subscapularis tendon: complete visualization across the width of the insertion point yielded no evidence of tendon thickening, calcification, or tears in the long axis view.  - Supraspinatus tendon:  complete visualization across the width of the insertion point yielded no evidence of tendon thickening, calcification, or tears in the long axis view. No evidence of bursal inflammation appreciated.  - Infraspinatus: visualization across the width of the insertion points yielded no evidence of tendon thickening. There was some cortical irregularities along the humerus at the insertion point best seen in the long axis view. No evidence of tearing. - AC Joint: No evidence of joint separation, collapse, or osteophyte development appreciated. No effusion present.   IMPRESSION: Cortical irregularities consistent with chronic tendinosis of infraspinatus but no overt tears.  Otherwise unremarkable shoulder ultrasound.  ASSESSMENT & PLAN:  1. R Infraspinatus Tendinopathy -Patient's exam and ultrasound findings are consistent with infraspinatus tendinopathy.  However, she has excellent strength with cuff testing.  She has not really tried any treatments for this and we will start conservatively with home exercise plan.  She develops a rash when taking oral NSAIDs so we will avoid those.  I encouraged other modalities such as Tylenol, heat/ice, or other topical treatments.  We will have her follow back up in about a month.  If no improvement in that time, she may be a candidate for a subacromial bursa corticosteroid injection.  All questions answered she agrees to plan.  Dortha Kern, MD PGY-4, Sports Medicine Fellow [facility]  Patient seen and evaluated with the sports medicine fellow.  I agree with the above plan of care.  Overall, ultrasound is reassuring today.  She  does have some chronic tendinopathy of the infraspinatus but excellent strength.  We will start with a home exercise program and she will follow-up in about 4 weeks for reevaluation.  I did discuss the possibility of a subacromial cortisone injection if her pain warrants it.

## 2022-01-10 NOTE — Telephone Encounter (Signed)
Error

## 2022-02-09 DIAGNOSIS — H02055 Trichiasis without entropian left lower eyelid: Secondary | ICD-10-CM | POA: Diagnosis not present

## 2022-02-17 DIAGNOSIS — R202 Paresthesia of skin: Secondary | ICD-10-CM | POA: Diagnosis not present

## 2022-02-17 DIAGNOSIS — E538 Deficiency of other specified B group vitamins: Secondary | ICD-10-CM | POA: Diagnosis not present

## 2022-02-26 DIAGNOSIS — M545 Low back pain, unspecified: Secondary | ICD-10-CM | POA: Diagnosis not present

## 2022-02-26 DIAGNOSIS — M542 Cervicalgia: Secondary | ICD-10-CM | POA: Diagnosis not present

## 2022-03-09 ENCOUNTER — Ambulatory Visit: Payer: Medicare HMO | Admitting: Psychiatry

## 2022-03-11 ENCOUNTER — Ambulatory Visit: Payer: BC Managed Care – PPO | Admitting: Diagnostic Neuroimaging

## 2022-03-11 ENCOUNTER — Telehealth: Payer: Self-pay | Admitting: Diagnostic Neuroimaging

## 2022-03-11 NOTE — Telephone Encounter (Signed)
Pt cancelled appt due to being sick this morning. Informed pt would have to call back on Monday to speak with Billing to pay show fee of $50. Pt verbalized understand.

## 2022-03-17 DIAGNOSIS — M4316 Spondylolisthesis, lumbar region: Secondary | ICD-10-CM | POA: Diagnosis not present

## 2022-03-17 DIAGNOSIS — G959 Disease of spinal cord, unspecified: Secondary | ICD-10-CM | POA: Diagnosis not present

## 2022-03-30 DIAGNOSIS — R202 Paresthesia of skin: Secondary | ICD-10-CM | POA: Diagnosis not present

## 2022-03-30 DIAGNOSIS — M4316 Spondylolisthesis, lumbar region: Secondary | ICD-10-CM | POA: Diagnosis not present

## 2022-03-30 DIAGNOSIS — M542 Cervicalgia: Secondary | ICD-10-CM | POA: Diagnosis not present

## 2022-03-30 DIAGNOSIS — G959 Disease of spinal cord, unspecified: Secondary | ICD-10-CM | POA: Diagnosis not present

## 2022-03-30 DIAGNOSIS — R200 Anesthesia of skin: Secondary | ICD-10-CM | POA: Diagnosis not present

## 2022-03-30 DIAGNOSIS — M545 Low back pain, unspecified: Secondary | ICD-10-CM | POA: Diagnosis not present

## 2022-04-06 DIAGNOSIS — Z03818 Encounter for observation for suspected exposure to other biological agents ruled out: Secondary | ICD-10-CM | POA: Diagnosis not present

## 2022-04-06 DIAGNOSIS — J069 Acute upper respiratory infection, unspecified: Secondary | ICD-10-CM | POA: Diagnosis not present

## 2022-04-06 DIAGNOSIS — R051 Acute cough: Secondary | ICD-10-CM | POA: Diagnosis not present

## 2022-04-08 ENCOUNTER — Ambulatory Visit: Payer: BC Managed Care – PPO | Admitting: Diagnostic Neuroimaging

## 2022-04-12 DIAGNOSIS — M4322 Fusion of spine, cervical region: Secondary | ICD-10-CM | POA: Diagnosis not present

## 2022-04-12 DIAGNOSIS — M9902 Segmental and somatic dysfunction of thoracic region: Secondary | ICD-10-CM | POA: Diagnosis not present

## 2022-04-12 DIAGNOSIS — M9901 Segmental and somatic dysfunction of cervical region: Secondary | ICD-10-CM | POA: Diagnosis not present

## 2022-04-12 DIAGNOSIS — M9903 Segmental and somatic dysfunction of lumbar region: Secondary | ICD-10-CM | POA: Diagnosis not present

## 2022-04-12 DIAGNOSIS — M4726 Other spondylosis with radiculopathy, lumbar region: Secondary | ICD-10-CM | POA: Diagnosis not present

## 2022-04-12 DIAGNOSIS — M5412 Radiculopathy, cervical region: Secondary | ICD-10-CM | POA: Diagnosis not present

## 2022-04-14 DIAGNOSIS — S299XXA Unspecified injury of thorax, initial encounter: Secondary | ICD-10-CM | POA: Diagnosis not present

## 2022-04-14 DIAGNOSIS — M4316 Spondylolisthesis, lumbar region: Secondary | ICD-10-CM | POA: Diagnosis not present

## 2022-04-14 DIAGNOSIS — M542 Cervicalgia: Secondary | ICD-10-CM | POA: Diagnosis not present

## 2022-04-14 DIAGNOSIS — R0781 Pleurodynia: Secondary | ICD-10-CM | POA: Diagnosis not present

## 2022-04-14 DIAGNOSIS — R202 Paresthesia of skin: Secondary | ICD-10-CM | POA: Diagnosis not present

## 2022-04-25 DIAGNOSIS — S2001XD Contusion of right breast, subsequent encounter: Secondary | ICD-10-CM | POA: Diagnosis not present

## 2022-05-01 DIAGNOSIS — S2341XA Sprain of ribs, initial encounter: Secondary | ICD-10-CM | POA: Diagnosis not present

## 2022-05-01 DIAGNOSIS — R52 Pain, unspecified: Secondary | ICD-10-CM | POA: Diagnosis not present

## 2022-05-11 ENCOUNTER — Ambulatory Visit (INDEPENDENT_AMBULATORY_CARE_PROVIDER_SITE_OTHER): Payer: Medicare HMO | Admitting: Psychiatry

## 2022-05-11 ENCOUNTER — Encounter: Payer: Self-pay | Admitting: Psychiatry

## 2022-05-11 DIAGNOSIS — F900 Attention-deficit hyperactivity disorder, predominantly inattentive type: Secondary | ICD-10-CM

## 2022-05-11 DIAGNOSIS — F411 Generalized anxiety disorder: Secondary | ICD-10-CM

## 2022-05-11 DIAGNOSIS — R69 Illness, unspecified: Secondary | ICD-10-CM | POA: Diagnosis not present

## 2022-05-11 DIAGNOSIS — F5105 Insomnia due to other mental disorder: Secondary | ICD-10-CM | POA: Diagnosis not present

## 2022-05-11 DIAGNOSIS — F331 Major depressive disorder, recurrent, moderate: Secondary | ICD-10-CM

## 2022-05-11 MED ORDER — FLUOXETINE HCL 20 MG PO CAPS: 20.0000 mg | ORAL_CAPSULE | Freq: Every day | ORAL | 1 refills | Status: DC

## 2022-05-11 MED ORDER — ALPRAZOLAM 0.5 MG PO TABS: ORAL_TABLET | ORAL | 5 refills | Status: DC

## 2022-05-11 NOTE — Progress Notes (Signed)
[name] [id number] 03/01/1951 72 y.o.    Subjective:   Patient ID:  Alyssa Vaughn is a 72 y.o. (DOB April 25, 1950) female.  Chief Complaint:  Chief Complaint  Patient presents with   Follow-up   ADHD   Depression   Anxiety    Anxiety Symptoms include decreased concentration. Patient reports no chest pain, confusion, nervous/anxious behavior, palpitations or suicidal ideas.    Depression        Associated symptoms include decreased concentration.  Associated symptoms include no suicidal ideas.  Past medical history includes anxiety.    Alyssa Vaughn presents to the office today for follow-up of increased fluoxetine for worsening depression without reason.    seen October 2020.  No meds were changed.  Fluoxetine 20 mg was continued.  07/09/2019 appointment the following is noted: Wants to try Quillivent bc easily confused, distracted and unfocused as noted. Good overall.  Fluoxetine helps anxiety and tears. Only taking Xanax 1/2 tablet at night.   Still ADD and head does not feel awake.  Hard to concentrate with groups.  But also doesn't feel quite awake and alert usually.   Feel like my brain won't wake up.  Tried modafinil 50 mg but nervous so much couldn't evaluate effect on ADD.  Has tried lots of ADD meds and didn't tolerate them. Grand kids in best.  [name] murdered but he hasn't been in the picture.  Pleased she's been active.  Not generally self-motivated and pleased she's stayed busy. 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  I just have to let it go.   Can't focus on [place].  She periodically struggles with sadness over the brokenness in the relationship.  Recognizes she craves his attention.  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  Don't need clonazepam at any other time.  Patient denies difficulty with sleep initiation or maintenance. 7-8 hours.  Denies appetite  disturbance.  Patient reports that energy and motivation have been good.  Patient denies any suicidal ideation.  No longer depressed usually and anxious but episodic.  Klonopin good once daily for anxiety.  Xanax for sleep ok. Plan: Start Quillivant 1 ml daily and may increase by 1 ml weekly as tolerated up to about 3-4 ml  09/03/2019 phone call that the Laurence Ferrari was too expensive and wanted a prescription for Ritalin 5 mg which was sent.  09/09/2019 appointment with the following noted: Ritalin helpful.  takiing up to BID and it makes a big difference in focus and productivity. Tolerating it well.   Taking a little alprazolam at night usually about 1/4 of 0.5 mg night.y.  Not taking clonazepam. Asks about taper BZ. Overall feels the best she's been.  After years of divorced she feels more free. Less fear spiritually now. No med changes  03/10/20 appt with following noted: Not taking stimulants. Taking 0.125 mg alprazolam HS.  Makes her sleepy a little.  Not sure if she needs it.  Wonders about stopping .   Overall good and bad.  Up and down mood with a lot of back problems which she's working on.  Sometimes unmanageable. Low days fight with herself, tired and uninterested.  Stress and obsess over [place]. Divorced 2008 and still not over it.  Still no life for herself. Don't get involved with things.  Only looks forward to popcorn and going to bed early.  Everything is drudgery and stay unmotivated. These sx come  and go.  It's always there in the background but waxes and wanes.   Working on it. Plan no changes and continue fluoxetine and Ritalin.  06/02/2020 appointment with following noted: Not taking Ritalin.  Continued fluoxetine.   Tried to address her issues with [name].  Other stuff still there with cry9ing and hard to focus and get through the day.  Weary of trying meds.  Trying to work on herself with self help.  Tried diet and exercise without change in sx.  Fear of going out witiut a  reason.  Feels stuck. Last filled Ritalin Augst 2021. Still depressed.  Struggles to get things done. Recent normal physical.  Plan:  Potentiate Abilify 2 mg tablet 1/2 daily for 2 weeks, then 1 tablet for sig depression  07/21/2020 appt noted: Good with aripiprazole it helped a lot noticeably, smooth Taking Abilify 2 mg daily. Got rid of heavy feeling of depresssion.  No crying.  Easier to do things and go places No SE with meds. Restarted Ritalin 5 mg TID too low.  Wants ER. Needs equivalent of 10 TID.  Helps the confusion of the day and improves focus and completion.  Good combo with Abilify. Plan:  Switch to LA Concerta 36 mg AM.  Call if too much.  09/01/2020 appointment noted: She called a couple times since being here and found both the 36 mg size and the 27 mg size of Concerta to be "too much" including symptoms of tension and anxiety and wanted to switch back to the Ritalin 5 mg 3 times daily.  She was offered the option of going to the lowest dose of Concerta 18 mg but declined. Ritalin 5 TID not very effective.  Would like to try lower dose of Concerta. Mood still very good and unusual for me.  Benefit Abilify.  Working on herself.   Plan for depression recommend potentiation with Abilify up to 2 mg daily.  Sleep is good.    03/03/2021 appointment with the following noted: Not taking Ritalin DT SE and up/down feelings from it.  It is helpful but not necessary. Saw benefit with Abilify in the beginning but didn't feel like herself.  Helped the depression and easier to go out and do things. Took the Abilify for 3-4 mos.  , Overall doing OK.  Better situation in the family with good grandparenting with [name] and does things with [place] socially and it helped.  Better sense of focus and direction for her life and less demanding of him.  Good relationship with [name] and the family.  Settling more into retirement.  Anxiety manageable. Exercise more consistent.  07/28/21 appt  noted: Taking fluoxetine 20 and Ritalin 5 mg TID. Without Ritalin sliding back into cycle of sadness, crying, poor focus.  Started Ritalin back and more motivation and concentration.. Moved appt up. Still struggles with sadness and low interest and energy and low self esteem.  No SI.  Persistent depression and she fights it with diet and exercise and activity.  Feels heavy Asked about retring Wellbutrin Plan: Reduce fluoxetine to 10 mg daily.  Call if any SE.  Add wellbutrin XL 150 mg daily for depression and stop stimulant.  08/24/21 appt noted: It failed.  Couldn't tolerate it for a week bc dry eyes re: wellbutrin. So decided to retry Abilify for mood. 2 mg daily and increased fluoxetine 20 mg daily. More steady and less down and more motivated with Abilify 2 daily.   SE a little foggy and hope for  no weight gain. Ritalin a little drying but asks about retrying dextrostat.  Not taking stimulant currently.  +12/01/21 appt noted: Arnette Norris has Covid and she was exposed. Has continued fluoxetine 20 mg daily without Abilify.  Stopped it DT tremor R hand. Added Ritalin 5 mg BID is usual.  Seems adequate duration.  Leveled things out with distraction, attn is much better.  Able to calm down and be present.  Tolerating it fine so far.  [facility] and tolerates it better than others she takes.  Yellow and the white one have been tolerated.   Mood is good  though initially worse off Abilify.  No crying now.  Mood smoother on Abilify. Plan: Still takes Xanax 0.125-0.25 HS Continue fluoxetine 20 mg daily.  Call if any SE.  hold Abilify 2 mg daily helped depression but tremor.  Consider Rexulti if needed Continue Ritalin 5 mg BID-TID  05/11/22 appt noted: Doing ok since then. Nov eyes got dry with Ritalin and stopped and retried.  Off for a month. Calmer and more centered in present and motivated with stimulant than without it.  Also less blah. Mood a little down but overall  steady. Frustrated with some medical problems and back pain.   No concerns with current meds.   Sleep is good overall except for rib pain lately.  Past Psychiatric Medication Trials: Modafinil SE, methylphenidate tablets,  , Dextrostat, Adderall, Ritalin, Vyvanse, Evekeo, Concerta 27 SE, Ritalin 5 mg BID tolerated mfg [employer], dry eyes Strattera, Deplin,  bupropion Fluoxetine 20, Lexapro, sertraline 37.5 side effects, venlafaxine, duloxetine, Pristiq, paroxetine, citalopram,  buspirone,  Abilify 2 marked benefit but CO some SE spacy and stopped. Clonazepam sed, Xanax,    Hydroxyzine Last counseling years ago.  Review of Systems:  Review of Systems  Cardiovascular:  Negative for chest pain and palpitations.  Musculoskeletal:  Positive for back pain.  Neurological:  Negative for tremors.  Psychiatric/Behavioral:  Positive for decreased concentration. Negative for agitation, behavioral problems, confusion, dysphoric mood, hallucinations, self-injury, sleep disturbance and suicidal ideas. The patient is not nervous/anxious and is not hyperactive.     Medications: I have reviewed the patient's current medications.  Current Outpatient Medications  Medication Sig Dispense Refill   b complex vitamins capsule Take 1 capsule by mouth daily.     CALCIUM PO Take by mouth.     Ivermectin (SOOLANTRA) 1 % CREA Apply 1 application topically daily. 30 g 2   NONFORMULARY OR COMPOUNDED ITEM 2% metronidazole ( crush tab or powder) in 1% hydrocortisone cream qsad 4 oz 1 each 0   Omega-3 1000 MG CAPS Take by mouth.     vitamin C (ASCORBIC ACID) 500 MG tablet Take 500 mg by mouth daily.     ALPRAZolam (XANAX) 0.5 MG tablet TAKE 1/2 TO 1 TABLET BY MOUTH AT BEDTIME AS NEEDED FOR ANXIETY OR SLEEP 30 tablet 5   FLUoxetine (PROZAC) 20 MG capsule Take 1 capsule (20 mg total) by mouth daily. 90 capsule 1   No current facility-administered medications for this visit.    Medication Side Effects:  None  Allergies:  Allergies  Allergen Reactions   Biaxin [Clarithromycin]     Upset stomach   Nsaids Rash    Past Medical History:  Diagnosis Date   Arthritis     History reviewed. No pertinent family history.  History, Surgical history, Social history, and Family history were reviewed and updated as appropriate.   Please see review of systems for further  details on the patient's review from today.   Objective:   Physical Exam:  There were no vitals taken for this visit.  Physical Exam Constitutional:      General: She is not in acute distress.    Appearance: She is well-developed.  Musculoskeletal:        General: No deformity.  Neurological:     Mental Status: She is alert and oriented to person, place, and time.     Motor: No tremor.     Coordination: Coordination normal.     Gait: Gait normal.  Psychiatric:        Attention and Perception: Perception normal. She is attentive.        Mood and Affect: Mood is anxious. Mood is not depressed. Affect is not labile, blunt, angry or inappropriate.        Speech: Speech normal.        Behavior: Behavior normal.        Thought Content: Thought content normal. Thought content is not delusional. Thought content does not include homicidal or suicidal ideation. Thought content does not include suicidal plan.        Cognition and Memory: Cognition normal.        Judgment: Judgment normal.     Comments: Insight and judgment good. No auditory or visual hallucinations. No delusions.  Less depression with Abilify but has done ok without it.       Lab Review:     Component Value Date/Time   NA 143 09/05/2017 1535   K 3.7 09/05/2017 1535   CL 106 09/05/2017 1535   CO2 29 09/05/2017 1535   GLUCOSE 99 09/05/2017 1535   BUN 14 09/05/2017 1535   CREATININE 0.69 09/05/2017 1535   CALCIUM 9.9 09/05/2017 1535   GFRNONAA >60 09/05/2017 1535   GFRAA >60 09/05/2017 1535       Component Value Date/Time   WBC 7.1 09/05/2017  1535   RBC 4.71 09/05/2017 1535   HGB 14.5 09/05/2017 1535   HCT 42.6 09/05/2017 1535   PLT 206 09/05/2017 1535   MCV 90.4 09/05/2017 1535   MCH 30.8 09/05/2017 1535   MCHC 34.0 09/05/2017 1535   RDW 11.6 09/05/2017 1535   LYMPHSABS 2.1 01/11/2009 1400   MONOABS 0.7 01/11/2009 1400   EOSABS 0.0 01/11/2009 1400   BASOSABS 0.0 01/11/2009 1400    No results found for: "POCLITH", "LITHIUM"   No results found for: "PHENYTOIN", "PHENOBARB", "VALPROATE", "CBMZ"   .res Assessment: Plan:    Attention deficit hyperactivity disorder (ADHD), predominantly inattentive type  Major depressive disorder, recurrent episode, moderate ([place]) - Plan: FLUoxetine (PROZAC) 20 MG capsule  Generalized anxiety disorder - Plan: FLUoxetine (PROZAC) 20 MG capsule  Insomnia due to mental condition - Plan: ALPRAZolam (XANAX) 0.5 MG tablet   Greater than 50% of 30 min face to face time with patient was spent on counseling and coordination of care. We discussed overall depression better .  Problems with ADD  Rec counseling.   Disc options. Alvester Chou PhD.   Consider alternative Trintellix but it might not be affordable.  Disc med sensitivity, discussed her concerns in general fearfulness around medications.   Continue fluoxetine 20 mg daily.  Call if any SE.  hold Abilify 2 mg daily helped depression but tremor.  Consider Rexulti if needed  More sx without stimulant.  Few other options other than modafinil off label.  Unless she gets eye doctor to solve dry eyes.  She has disc this problem  with them before.  Long history of dry eyes.  Discussed potential benefits, risks, and side effects of stimulants with patient to include increased heart rate, palpitations, insomnia, increased anxiety, increased irritability, or decreased appetite.  Instructed patient to contact office if experiencing any significant tolerability issues. Failed to tolerate Concerta 36 and 27 mg.  Has the option of trying the 18 mg.  Failed multiple stimulants DT tolerability px.  Probably can't afford liquid stimulant which is the only option left.  Option retry Dextrostat but she wishes to defer. Continue Ritalin   We discussed the short-term risks associated with benzodiazepines including sedation and increased fall risk among others.  Discussed long-term side effect risk including dependence, potential withdrawal symptoms, and the potential eventual dose-related risk of dementia.  Disc unusual combo stimulants but is serving good medical purpose.  Still takes Xanax 0.125-0.25 HS Continue fluoxetine 20 mg daily.  Call if any SE.  hold Abilify 2 mg daily helped depression but tremor.   Trial modafinil 50 mg AM  FU 66mo  CLynder Parents MD, DFAPA   Please see After Visit Summary for patient specific instructions.  No future appointments.   No orders of the defined types were placed in this encounter.     -------------------------------

## 2022-05-12 DIAGNOSIS — H00025 Hordeolum internum left lower eyelid: Secondary | ICD-10-CM | POA: Diagnosis not present

## 2022-05-12 DIAGNOSIS — S299XXA Unspecified injury of thorax, initial encounter: Secondary | ICD-10-CM | POA: Diagnosis not present

## 2022-05-12 DIAGNOSIS — R0781 Pleurodynia: Secondary | ICD-10-CM | POA: Diagnosis not present

## 2022-05-26 DIAGNOSIS — R69 Illness, unspecified: Secondary | ICD-10-CM | POA: Diagnosis not present

## 2022-06-01 ENCOUNTER — Telehealth: Payer: Self-pay | Admitting: Psychiatry

## 2022-06-01 ENCOUNTER — Other Ambulatory Visit: Payer: Self-pay | Admitting: Psychiatry

## 2022-06-01 DIAGNOSIS — F900 Attention-deficit hyperactivity disorder, predominantly inattentive type: Secondary | ICD-10-CM

## 2022-06-01 MED ORDER — DEXTROAMPHETAMINE SULFATE 5 MG PO TABS: 5.0000 mg | ORAL_TABLET | Freq: Every day | ORAL | 0 refills | Status: DC

## 2022-06-01 NOTE — Telephone Encounter (Signed)
Sent dextrostat.  DC modafinil

## 2022-06-01 NOTE — Telephone Encounter (Signed)
Pt LVM@ 9:25a.  She said the Modafinil caused her headaches.  She would like to know if she can try the Dextrostat that she and Dr Clovis Pu discussed.  If yes, [facility]  [place].  Next appt 8/7

## 2022-06-01 NOTE — Telephone Encounter (Signed)
Patient called to say that the Modafinil is causing headaches. She said from the first dose she has had a headache, took it for 13 days. She is asking to retry Dextrostat.

## 2022-06-01 NOTE — Telephone Encounter (Signed)
LVM to RC 

## 2022-06-02 ENCOUNTER — Telehealth: Payer: Self-pay

## 2022-06-02 ENCOUNTER — Other Ambulatory Visit: Payer: Self-pay

## 2022-06-02 DIAGNOSIS — F900 Attention-deficit hyperactivity disorder, predominantly inattentive type: Secondary | ICD-10-CM

## 2022-06-02 MED ORDER — DEXTROAMPHETAMINE SULFATE 5 MG PO TABS: 5.0000 mg | ORAL_TABLET | Freq: Every day | ORAL | 0 refills | Status: DC

## 2022-06-02 NOTE — Telephone Encounter (Signed)
LVM to RC 

## 2022-06-02 NOTE — Telephone Encounter (Signed)
Patient lvm stating the pharmacy didn't get the Dextrostat. It indicated in the encounter [facility] [place] [facility]. [facility] is listed where it was received which is incorrect. Contact patient per her request when Rx is available for pick [facility] at [facility]. # [phone]

## 2022-06-02 NOTE — Telephone Encounter (Signed)
Patient notified

## 2022-06-02 NOTE — Telephone Encounter (Signed)
Will cancel Rx at [facility] and repend to Dr. Clovis Pu for [facility].

## 2022-06-27 DIAGNOSIS — J019 Acute sinusitis, unspecified: Secondary | ICD-10-CM | POA: Diagnosis not present

## 2022-07-02 DIAGNOSIS — R42 Dizziness and giddiness: Secondary | ICD-10-CM | POA: Diagnosis not present

## 2022-07-02 DIAGNOSIS — R0789 Other chest pain: Secondary | ICD-10-CM | POA: Diagnosis not present

## 2022-07-11 ENCOUNTER — Other Ambulatory Visit: Payer: Self-pay | Admitting: Psychiatry

## 2022-07-11 ENCOUNTER — Telehealth: Payer: Self-pay | Admitting: Psychiatry

## 2022-07-11 DIAGNOSIS — F900 Attention-deficit hyperactivity disorder, predominantly inattentive type: Secondary | ICD-10-CM

## 2022-07-11 MED ORDER — DEXTROAMPHETAMINE SULFATE 5 MG PO TABS: 5.0000 mg | ORAL_TABLET | Freq: Two times a day (BID) | ORAL | 0 refills | Status: DC

## 2022-07-11 MED ORDER — DEXTROAMPHETAMINE SULFATE 5 MG PO TABS: 5.0000 mg | ORAL_TABLET | Freq: Every day | ORAL | 0 refills | Status: DC

## 2022-07-11 NOTE — Telephone Encounter (Signed)
Pt called requesting refill of [facility]xtrostat to  [id number] [place]  - [address] AT [facility] [address] [place] [address] Phone: [phone]  Fax: [phone]   Next appt 8/7

## 2022-07-12 DIAGNOSIS — F339 Major depressive disorder, recurrent, unspecified: Secondary | ICD-10-CM | POA: Diagnosis not present

## 2022-07-12 DIAGNOSIS — L200 Besnier's prurigo: Secondary | ICD-10-CM | POA: Diagnosis not present

## 2022-08-02 ENCOUNTER — Ambulatory Visit: Payer: Medicare HMO | Admitting: Psychiatry

## 2022-08-02 ENCOUNTER — Encounter: Payer: Self-pay | Admitting: Psychiatry

## 2022-08-02 DIAGNOSIS — F5105 Insomnia due to other mental disorder: Secondary | ICD-10-CM | POA: Diagnosis not present

## 2022-08-02 DIAGNOSIS — F411 Generalized anxiety disorder: Secondary | ICD-10-CM | POA: Diagnosis not present

## 2022-08-02 DIAGNOSIS — F331 Major depressive disorder, recurrent, moderate: Secondary | ICD-10-CM | POA: Diagnosis not present

## 2022-08-02 DIAGNOSIS — F900 Attention-deficit hyperactivity disorder, predominantly inattentive type: Secondary | ICD-10-CM

## 2022-08-02 MED ORDER — PRAMIPEXOLE DIHYDROCHLORIDE 0.25 MG PO TABS: ORAL_TABLET | ORAL | 0 refills | Status: DC

## 2022-08-02 NOTE — Progress Notes (Signed)
Alyssa Vaughn [id number] 01-13-1951 72 y.o.    Subjective:   Patient ID:  Alyssa Vaughn is a 72 y.o. (DOB [birth year]) female.  Chief Complaint:  Chief Complaint  Patient presents with   Follow-up   ADD   Depression   Anxiety    Anxiety Symptoms include decreased concentration. Patient reports no chest pain, confusion, nervous/anxious behavior, palpitations or suicidal ideas.    Depression        Associated symptoms include decreased concentration.  Associated symptoms include no suicidal ideas.  Past medical history includes anxiety.    Alyssa Vaughn presents to the office today for follow-up of increased fluoxetine for worsening depression without reason.    seen October 2020.  No meds were changed.  Fluoxetine 20 mg was continued.  07/09/2019 appointment the following is noted: Wants to try Quillivent bc easily confused, distracted and unfocused as noted. Good overall.  Fluoxetine helps anxiety and tears. Only taking Xanax 1/2 tablet at night.   Still ADD and head does not feel awake.  Hard to concentrate with groups.  But also doesn't feel quite awake and alert usually.   Feel like my brain won't wake up.  Tried modafinil 50 mg but nervous so much couldn't evaluate effect on ADD.  Has tried lots of ADD meds and didn't tolerate them. Grand kids in best.  [name] murdered but he hasn't been in the picture.  Pleased she's been active.  Not generally self-motivated and pleased she's stayed busy. 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  I just have to let it go.   Can't focus on [place].  She periodically struggles with sadness over the brokenness in the relationship.  Recognizes she craves his attention.  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  Don't need clonazepam at any other time.  Patient denies difficulty with sleep initiation or maintenance. 7-8 hours.  Denies appetite  disturbance.  Patient reports that energy and motivation have been good.  Patient denies any suicidal ideation.  No longer depressed usually and anxious but episodic.  Klonopin good once daily for anxiety.  Xanax for sleep ok. Plan: Start Quillivant 1 ml daily and may increase by 1 ml weekly as tolerated up to about 3-4 ml  09/03/2019 phone call that the [name] was too expensive and wanted a prescription for Ritalin 5 mg which was sent.  09/09/2019 appointment with the following noted: Ritalin helpful.  takiing up to BID and it makes a big difference in focus and productivity. Tolerating it well.   Taking a little alprazolam at night usually about 1/4 of 0.5 mg night.y.  Not taking clonazepam. Asks about taper BZ. Overall feels the best she's been.  After years of divorced she feels more free. Less fear spiritually now. No med changes  03/10/20 appt with following noted: Not taking stimulants. Taking 0.125 mg alprazolam HS.  Makes her sleepy a little.  Not sure if she needs it.  Wonders about stopping .   Overall good and bad.  Up and down mood with a lot of back problems which she's working on.  Sometimes unmanageable. Low days fight with herself, tired and uninterested.  Stress and obsess over [place]. Divorced 2008 and still not over it.  Still no life for herself. Don't get involved with things.  Only looks forward to popcorn and going to bed early.  Everything is drudgery and stay unmotivated. These sx come  and go.  It's always there in the background but waxes and wanes.   Working on it. Plan no changes and continue fluoxetine and Ritalin.  06/02/2020 appointment with following noted: Not taking Ritalin.  Continued fluoxetine.   Tried to address her issues with [name].  Other stuff still there with cry9ing and hard to focus and get through the day.  Weary of trying meds.  Trying to work on herself with self help.  Tried diet and exercise without change in sx.  Fear of going out witiut a  reason.  Feels stuck. Last filled Ritalin Augst 2021. Still depressed.  Struggles to get things done. Recent normal physical.  Plan:  Potentiate Abilify 2 mg tablet 1/2 daily for 2 weeks, then 1 tablet for sig depression  07/21/2020 appt noted: Good with aripiprazole it helped a lot noticeably, smooth Taking Abilify 2 mg daily. Got rid of heavy feeling of depresssion.  No crying.  Easier to do things and go places No SE with meds. Restarted Ritalin 5 mg TID too low.  Wants ER. Needs equivalent of 10 TID.  Helps the confusion of the day and improves focus and completion.  Good combo with Abilify. Plan:  Switch to LA Concerta 36 mg AM.  Call if too much.  09/01/2020 appointment noted: She called a couple times since being here and found both the 36 mg size and the 27 mg size of Concerta to be "too much" including symptoms of tension and anxiety and wanted to switch back to the Ritalin 5 mg 3 times daily.  She was offered the option of going to the lowest dose of Concerta 18 mg but declined. Ritalin 5 TID not very effective.  Would like to try lower dose of Concerta. Mood still very good and unusual for me.  Benefit Abilify.  Working on herself.   Plan for depression recommend potentiation with Abilify up to 2 mg daily.  Sleep is good.    03/03/2021 appointment with the following noted: Not taking Ritalin DT SE and up/down feelings from it.  It is helpful but not necessary. Saw benefit with Abilify in the beginning but didn't feel like herself.  Helped the depression and easier to go out and do things. Took the Abilify for 3-4 mos.  , Overall doing OK.  Better situation in the family with good grandparenting with [name] and does things with [place] socially and it helped.  Better sense of focus and direction for her life and less demanding of him.  Good relationship with [name] and the family.  Settling more into retirement.  Anxiety manageable. Exercise more consistent.  07/28/21 appt  noted: Taking fluoxetine 20 and Ritalin 5 mg TID. Without Ritalin sliding back into cycle of sadness, crying, poor focus.  Started Ritalin back and more motivation and concentration.. Moved appt up. Still struggles with sadness and low interest and energy and low self esteem.  No SI.  Persistent depression and she fights it with diet and exercise and activity.  Feels heavy Asked about retring Wellbutrin Plan: Reduce fluoxetine to 10 mg daily.  Call if any SE.  Add wellbutrin XL 150 mg daily for depression and stop stimulant.  08/24/21 appt noted: It failed.  Couldn't tolerate it for a week bc dry eyes re: wellbutrin. So decided to retry Abilify for mood. 2 mg daily and increased fluoxetine 20 mg daily. More steady and less down and more motivated with Abilify 2 daily.   SE a little foggy and hope for  no weight gain. Ritalin a little drying but asks about retrying dextrostat.  Not taking stimulant currently.  +12/01/21 appt noted: Loistine Chance has Covid and she was exposed. Has continued fluoxetine 20 mg daily without Abilify.  Stopped it DT tremor R hand. Added Ritalin 5 mg BID is usual.  Seems adequate duration.  Leveled things out with distraction, attn is much better.  Able to calm down and be present.  Tolerating it fine so far.  Using the generic from [facility] and tolerates it better than others she takes.  Yellow and the white one have been tolerated.   Mood is good  though initially worse off Abilify.  No crying now.  Mood smoother on Abilify. Plan: Still takes Xanax 0.125-0.25 HS Continue fluoxetine 20 mg daily.  Call if any SE.  hold Abilify 2 mg daily helped depression but tremor.  Consider Rexulti if needed Continue Ritalin 5 mg BID-TID  05/11/22 appt noted: Doing ok since then. Nov eyes got dry with Ritalin and stopped and retried.  Off for a month. Calmer and more centered in present and motivated with stimulant than without it.  Also less blah. Mood a little down but overall  steady. Frustrated with some medical problems and back pain.   No concerns with current meds.   Sleep is good overall except for rib pain lately. Plan: Still takes Xanax 0.125-0.25 HS Continue fluoxetine 20 mg daily.  Call if any SE.  hold Abilify 2 mg daily helped depression but tremor.   Trial modafinil 50 mg AM  08/02/22  appt noted: No modafinil DT HA. Not taking dextrostat DT heart racing even at 2.5 mg. More crying lately for 2 mos.  Hard to get through the day.  Heavy feeling and can't see reason for the crying.   Stimulant helped motivation but mood is worse off it. Decided prozac not working and reduced it to 10 mg daily. She's been researching and thinks what she is missing is DA.  Taking Velvet bean OTC which is supposed to help but no effect noted.     Past Psychiatric Medication Trials: Modafinil SE, methylphenidate tablets,  , Dextrostat, Adderall, Ritalin, Vyvanse, Evekeo, Concerta 27 SE, Ritalin 5 mg BID tolerated mfg [employer], dry eyes Modafinil 50 HA Strattera, Deplin,  bupropion Fluoxetine 20, Lexapro, sertraline 37.5 side effects, venlafaxine, duloxetine, Pristiq, paroxetine, citalopram,  buspirone,  Abilify 2 marked benefit but CO some SE spacy and stopped. Clonazepam sed, Xanax,    Hydroxyzine Last counseling years ago.  Review of Systems:  Review of Systems  Cardiovascular:  Negative for chest pain and palpitations.  Musculoskeletal:  Positive for back pain.  Neurological:  Negative for tremors.  Psychiatric/Behavioral:  Positive for decreased concentration and dysphoric mood. Negative for agitation, behavioral problems, confusion, hallucinations, self-injury, sleep disturbance and suicidal ideas. The patient is not nervous/anxious and is not hyperactive.     Medications: I have reviewed the patient's current medications.  Current Outpatient Medications  Medication Sig Dispense Refill   ALPRAZolam (XANAX) 0.5 MG tablet TAKE 1/2 TO 1 TABLET BY MOUTH  AT BEDTIME AS NEEDED FOR ANXIETY OR SLEEP 30 tablet 5   b complex vitamins capsule Take 1 capsule by mouth daily.     CALCIUM PO Take by mouth.     [START ON 08/08/2022] dextroamphetamine (DEXTROSTAT) 5 MG tablet Take 1 tablet (5 mg total) by mouth 2 (two) times daily. (Patient not taking: Reported on 08/02/2022) 60 tablet 0   FLUoxetine (PROZAC) 20 MG capsule Take  1 capsule (20 mg total) by mouth daily. (Patient taking differently: Take 10 mg by mouth daily.) 90 capsule 1   Ivermectin (SOOLANTRA) 1 % CREA Apply 1 application topically daily. 30 g 2   NONFORMULARY OR COMPOUNDED ITEM 2% metronidazole ( crush tab or powder) in 1% hydrocortisone cream qsad 4 oz 1 each 0   Omega-3 1000 MG CAPS Take by mouth.     pramipexole (MIRAPEX) 0.25 MG tablet 1/2 tablet in the AM for 3 days, Then 1 tablet each AM for 3 days, Then 1 in the AM and 1/2 tablet in PM for 3 days, Then 1 twice daily 60 tablet 0   vitamin C (ASCORBIC ACID) 500 MG tablet Take 500 mg by mouth daily.     dextroamphetamine (DEXTROSTAT) 5 MG tablet Take 1 tablet (5 mg total) by mouth daily. (Patient not taking: Reported on 08/02/2022) 30 tablet 0   No current facility-administered medications for this visit.    Medication Side Effects: None  Allergies:  Allergies  Allergen Reactions   Biaxin [Clarithromycin]     Upset stomach   Nsaids Rash    Past Medical History:  Diagnosis Date   Arthritis     History reviewed. No pertinent family history.  History, Surgical history, Social history, and Family history were reviewed and updated as appropriate.   Please see review of systems for further details on the patient's review from today.   Objective:   Physical Exam:  There were no vitals taken for this visit.  Physical Exam Constitutional:      General: She is not in acute distress.    Appearance: She is well-developed.  Musculoskeletal:        General: No deformity.  Neurological:     Mental Status: She is alert and  oriented to person, place, and time.     Motor: No tremor.     Coordination: Coordination normal.     Gait: Gait normal.  Psychiatric:        Attention and Perception: Perception normal. She is attentive.        Mood and Affect: Mood is anxious and depressed. Affect is tearful. Affect is not labile, blunt, angry or inappropriate.        Speech: Speech normal.        Behavior: Behavior normal.        Thought Content: Thought content normal. Thought content is not delusional. Thought content does not include homicidal or suicidal ideation. Thought content does not include suicidal plan.        Cognition and Memory: Cognition normal.        Judgment: Judgment normal.     Comments: Insight and judgment good. No auditory or visual hallucinations. No delusions.         Lab Review:     Component Value Date/Time   NA 143 09/05/2017 1535   K 3.7 09/05/2017 1535   CL 106 09/05/2017 1535   CO2 29 09/05/2017 1535   GLUCOSE 99 09/05/2017 1535   BUN 14 09/05/2017 1535   CREATININE 0.69 09/05/2017 1535   CALCIUM 9.9 09/05/2017 1535   GFRNONAA >60 09/05/2017 1535   GFRAA >60 09/05/2017 1535       Component Value Date/Time   WBC 7.1 09/05/2017 1535   RBC 4.71 09/05/2017 1535   HGB 14.5 09/05/2017 1535   HCT 42.6 09/05/2017 1535   PLT 206 09/05/2017 1535   MCV 90.4 09/05/2017 1535   MCH 30.8 09/05/2017 1535   MCHC  34.0 09/05/2017 1535   RDW 11.6 09/05/2017 1535   LYMPHSABS 2.1 01/11/2009 1400   MONOABS 0.7 01/11/2009 1400   EOSABS 0.0 01/11/2009 1400   BASOSABS 0.0 01/11/2009 1400    No results found for: "POCLITH", "LITHIUM"   No results found for: "PHENYTOIN", "PHENOBARB", "VALPROATE", "CBMZ"   .res Assessment: Plan:    Major depressive disorder, recurrent episode, moderate (HCC) - Plan: pramipexole (MIRAPEX) 0.25 MG tablet  Attention deficit hyperactivity disorder (ADHD), predominantly inattentive type - Plan: pramipexole (MIRAPEX) 0.25 MG tablet  Generalized anxiety  disorder - Plan: pramipexole (MIRAPEX) 0.25 MG tablet  Insomnia due to mental condition - Plan: pramipexole (MIRAPEX) 0.25 MG tablet   Greater than 50% of 30 min face to face time with patient was spent on counseling and coordination of care. We discussed overall depression better .  Problems with ADD  Rec counseling.   Disc options. Lorenda Cahill PhD.   Consider alternative Trintellix but it might not be affordable.  Disc med sensitivity, discussed her concerns in general fearfulness around medications.   Continue fluoxetine 20 mg daily.  Call if any SE.  hold Abilify 2 mg daily helped depression but tremor.  Consider Rexulti if needed  More sx without stimulant.  Few other options other than modafinil off label.  Unless she gets eye doctor to solve dry eyes.  She has disc this problem with them before.  Long history of dry eyes.  Discussed potential benefits, risks, and side effects of stimulants with patient to include increased heart rate, palpitations, insomnia, increased anxiety, increased irritability, or decreased appetite.  Instructed patient to contact office if experiencing any significant tolerability issues. Failed to tolerate Concerta 36 and 27 mg.  Has the option of trying the 18 mg. Failed multiple stimulants DT tolerability px.  Probably can't afford liquid stimulant which is the only option left.  Option retry Dextrostat but she wishes to defer. Continue Ritalin   We discussed the short-term risks associated with benzodiazepines including sedation and increased fall risk among others.  Discussed long-term side effect risk including dependence, potential withdrawal symptoms, and the potential eventual dose-related risk of dementia.  Disc unusual combo stimulants but is serving good medical purpose.  Still takes Xanax 0.125-0.25 HS Continue fluoxetine 20 mg daily.  Call if any SE.  hold Abilify 2 mg daily helped depression but tremor.   (NAC) N-Acetylcysteine 2 of the  600 mg  capsules daily to help with mild cognitive problems.  It can be combined with a B-complex vitamin as the B-12 and folate which can sometimes enhance the effect.  Disc off label for depression and SE: Pramipexole 1/2 tablet in the AM for 3 days,  Then 1 tablet each AM for 3 days,  Then 1 in the AM and 1/2 tablet in PM for 3 days, Then 1 twice daily  FU 6 weeks  Meredith Staggers, MD, DFAPA   Please see After Visit Summary for patient specific instructions.  Future Appointments  Date Time Provider Department Center  08/10/2022  9:30 AM Suanne Marker, MD [facility] None  08/31/2022  9:00 AM Cottle, Steva Ready., MD [facility] None  11/09/2022  9:00 AM Cottle, Steva Ready., MD [facility] None     No orders of the defined types were placed in this encounter.     -------------------------------

## 2022-08-02 NOTE — Patient Instructions (Addendum)
(  NAC) N-Acetylcysteine 1 of the  600 mg capsules daily to help with mild cognitive problems.  It can be combined with a B-complex vitamin as the B-12 and folate which can sometimes enhance the effect. ([employer], [employer])  Pramipexole 1/2 tablet in the AM for 3 days,  Then 1 tablet each AM for 3 days,  Then 1 in the AM and 1/2 tablet in PM for 3 days, Then 1 twice daily

## 2022-08-03 DIAGNOSIS — Z01419 Encounter for gynecological examination (general) (routine) without abnormal findings: Secondary | ICD-10-CM | POA: Diagnosis not present

## 2022-08-03 DIAGNOSIS — Z681 Body mass index (BMI) 19 or less, adult: Secondary | ICD-10-CM | POA: Diagnosis not present

## 2022-08-03 DIAGNOSIS — Z1231 Encounter for screening mammogram for malignant neoplasm of breast: Secondary | ICD-10-CM | POA: Diagnosis not present

## 2022-08-03 DIAGNOSIS — N952 Postmenopausal atrophic vaginitis: Secondary | ICD-10-CM | POA: Diagnosis not present

## 2022-08-04 DIAGNOSIS — Z03818 Encounter for observation for suspected exposure to other biological agents ruled out: Secondary | ICD-10-CM | POA: Diagnosis not present

## 2022-08-04 DIAGNOSIS — J069 Acute upper respiratory infection, unspecified: Secondary | ICD-10-CM | POA: Diagnosis not present

## 2022-08-04 DIAGNOSIS — R051 Acute cough: Secondary | ICD-10-CM | POA: Diagnosis not present

## 2022-08-04 DIAGNOSIS — J209 Acute bronchitis, unspecified: Secondary | ICD-10-CM | POA: Diagnosis not present

## 2022-08-04 DIAGNOSIS — I700 Atherosclerosis of aorta: Secondary | ICD-10-CM | POA: Diagnosis not present

## 2022-08-09 ENCOUNTER — Telehealth: Payer: Self-pay | Admitting: Psychiatry

## 2022-08-09 ENCOUNTER — Other Ambulatory Visit: Payer: Self-pay | Admitting: Psychiatry

## 2022-08-09 MED ORDER — BUPROPION HCL 75 MG PO TABS: 37.5000 mg | ORAL_TABLET | Freq: Two times a day (BID) | ORAL | 1 refills | Status: DC

## 2022-08-09 NOTE — Telephone Encounter (Signed)
Pt LVM @ 10:04a.  She said she has been on 10mg  Fluoxetine.  She started a new script and Dr Jennelle Human advised her to let him know if it didn't agree with her.  She said it does not agree with her; it's giving her headaches and dizziness. She wants to know if she can try a small dose of Wellbutrin.  Next appt 5/29

## 2022-08-09 NOTE — Telephone Encounter (Signed)
Patient took pramipexole 1/2 tab x 3 days and 1 tab x 3 days and then stopped it. She reported being lightheaded and dizzy and not able to tolerate. She has been off for 2 days and is back to baseline. She is asking about low-dose Wellbutrin. She said you discussed this. She said she was sensitive to meds, but would like to try a low dose. She reported trying previously. From 07/25/21 note:  Add wellbutrin XL 150 mg daily for depression and stop stimulant.   I don't know how long she took it, but it was discontinued at her appt in May 2023. I didn't see mention why medication was stopped.   Pharmacy - [facility] [address].

## 2022-08-09 NOTE — Telephone Encounter (Signed)
Sent lowest dose of wellbutrin available.

## 2022-08-10 ENCOUNTER — Ambulatory Visit: Payer: Medicare HMO | Admitting: Diagnostic Neuroimaging

## 2022-08-10 ENCOUNTER — Encounter: Payer: Self-pay | Admitting: Diagnostic Neuroimaging

## 2022-08-10 VITALS — BP 134/82 | HR 6 | Ht 62.0 in | Wt 104.0 lb

## 2022-08-10 DIAGNOSIS — R202 Paresthesia of skin: Secondary | ICD-10-CM | POA: Diagnosis not present

## 2022-08-10 NOTE — Patient Instructions (Signed)
BENIGN PARESTHESIAS (face, arms, legs) - neuro exam normal; symptoms are mild; monitor for now - continue B12 replacement

## 2022-08-10 NOTE — Progress Notes (Signed)
[facility]  PATIENT: Alyssa Vaughn DOB: [birth year]  REFERRING CLINICIAN: Mila Palmer, MD HISTORY FROM: patient REASON FOR VISIT: new consult   HISTORICAL  CHIEF COMPLAINT:  Chief Complaint  Patient presents with   Numbness    Rm 7 alone Pt is well, reports she has been having a tingling, pins and needles sensation for about 2 years. It started in her lower extremities and is now in her upper extremities as well.     HISTORY OF PRESENT ILLNESS:   72 year old female here for evaluation of numbness and tingling.  Symptoms started around 2022.  She describes numbness and tingling sensation in her arms, legs, hands and feet.  Also has history of cervical spine surgery and chronic low back pain.  Was prescribed low-dose gabapentin but had not tried it yet.   REVIEW OF SYSTEMS: Full 14 system review of systems performed and negative with exception of: as per HPI.  ALLERGIES: Allergies  Allergen Reactions   Biaxin [Clarithromycin]     Upset stomach   Nsaids Rash    HOME MEDICATIONS: Outpatient Medications Prior to Visit  Medication Sig Dispense Refill   ALPRAZolam (XANAX) 0.5 MG tablet TAKE 1/2 TO 1 TABLET BY MOUTH AT BEDTIME AS NEEDED FOR ANXIETY OR SLEEP 30 tablet 5   buPROPion (WELLBUTRIN) 75 MG tablet Take 0.5 tablets (37.5 mg total) by mouth 2 (two) times daily. 30 tablet 1   FLUoxetine (PROZAC) 20 MG capsule Take 1 capsule (20 mg total) by mouth daily. (Patient taking differently: Take 10 mg by mouth daily.) 90 capsule 1   Ivermectin (SOOLANTRA) 1 % CREA Apply 1 application topically daily. 30 g 2   Omega-3 1000 MG CAPS Take by mouth.     vitamin C (ASCORBIC ACID) 500 MG tablet Take 500 mg by mouth daily.     b complex vitamins capsule Take 1 capsule by mouth daily. (Patient not taking: Reported on 08/10/2022)     CALCIUM PO Take by mouth. (Patient not taking: Reported on 08/10/2022)     dextroamphetamine (DEXTROSTAT) 5 MG tablet Take 1 tablet (5  mg total) by mouth daily. (Patient not taking: Reported on 08/02/2022) 30 tablet 0   dextroamphetamine (DEXTROSTAT) 5 MG tablet Take 1 tablet (5 mg total) by mouth 2 (two) times daily. (Patient not taking: Reported on 08/02/2022) 60 tablet 0   No facility-administered medications prior to visit.    PAST MEDICAL HISTORY: Past Medical History:  Diagnosis Date   Arthritis     PAST SURGICAL HISTORY: Past Surgical History:  Procedure Laterality Date   APPENDECTOMY     HERNIA REPAIR      FAMILY HISTORY: History reviewed. No pertinent family history.  SOCIAL HISTORY: Social History   Socioeconomic History   Marital status: Divorced    Spouse name: Not on file   Number of children: Not on file   Years of education: Not on file   Highest education level: Not on file  Occupational History   Not on file  Tobacco Use   Smoking status: Never   Smokeless tobacco: Never  Vaping Use   Vaping Use: Never used  Substance and Sexual Activity   Alcohol use: Never   Drug use: Never   Sexual activity: Not on file  Other Topics Concern   Not on file  Social History Narrative   Not on file   Social Determinants of Health   Financial Resource Strain: Not on file  Food Insecurity: Not on file  Transportation Needs: Not on file  Physical Activity: Not on file  Stress: Not on file  Social Connections: Not on file  Intimate Partner Violence: Not on file     PHYSICAL EXAM  GENERAL EXAM/CONSTITUTIONAL: Vitals:  Vitals:   08/10/22 1017  BP: 134/82  Pulse: (!) 6  Weight: 104 lb (47.2 kg)  Height: 5\' 2"  (1.575 m)   Body mass index is 19.02 kg/m. Wt Readings from Last 3 Encounters:  08/10/22 104 lb (47.2 kg)  12/28/21 105 lb (47.6 kg)  09/05/17 111 lb (50.3 kg)   Patient is in no distress; well developed, nourished and groomed; neck is supple  CARDIOVASCULAR: Examination of carotid arteries is normal; no carotid bruits Regular rate and rhythm, no murmurs Examination of  peripheral vascular system by observation and palpation is normal  EYES: Ophthalmoscopic exam of optic discs and posterior segments is normal; no papilledema or hemorrhages No results found.  MUSCULOSKELETAL: Gait, strength, tone, movements noted in Neurologic exam below  NEUROLOGIC: MENTAL STATUS:      No data to display         awake, alert, oriented to person, place and time recent and remote memory intact normal attention and concentration language fluent, comprehension intact, naming intact fund of knowledge appropriate  CRANIAL NERVE:  2nd - no papilledema on fundoscopic exam 2nd, 3rd, 4th, 6th - pupils equal and reactive to light, visual fields full to confrontation, extraocular muscles intact, no nystagmus 5th - facial sensation symmetric 7th - facial strength symmetric 8th - hearing intact 9th - palate elevates symmetrically, uvula midline 11th - shoulder shrug symmetric 12th - tongue protrusion midline  MOTOR:  normal bulk and tone, full strength in the BUE, BLE  SENSORY:  normal and symmetric to light touch, temperature, vibration  COORDINATION:  finger-nose-finger, fine finger movements normal  REFLEXES:  deep tendon reflexes 1+ and symmetric  GAIT/STATION:  narrow based gait     DIAGNOSTIC DATA (LABS, IMAGING, TESTING) - I reviewed patient records, labs, notes, testing and imaging myself where available.  Lab Results  Component Value Date   WBC 7.1 09/05/2017   HGB 14.5 09/05/2017   HCT 42.6 09/05/2017   MCV 90.4 09/05/2017   PLT 206 09/05/2017      Component Value Date/Time   NA 143 09/05/2017 1535   K 3.7 09/05/2017 1535   CL 106 09/05/2017 1535   CO2 29 09/05/2017 1535   GLUCOSE 99 09/05/2017 1535   BUN 14 09/05/2017 1535   CREATININE 0.69 09/05/2017 1535   CALCIUM 9.9 09/05/2017 1535   GFRNONAA >60 09/05/2017 1535   GFRAA >60 09/05/2017 1535   No results found for: "CHOL", "HDL", "LDLCALC", "LDLDIRECT", "TRIG", "CHOLHDL" No  results found for: "HGBA1C" No results found for: "VITAMINB12" Lab Results  Component Value Date   TSH 5.276 (H) 09/05/2017        ASSESSMENT AND PLAN  72 y.o. year old female here with:   Dx:  1. Paresthesias     PLAN:  BENIGN PARESTHESIAS (face, arms, legs; mild; since 2022; no progression; some anxiety sxs) - neuro exam normal; symptoms are mild; monitor for now; consider addl testing if symptoms worsen - continue B12 replacement per PCP  Return for pending if symptoms worsen or fail to improve.    Suanne Marker, MD 08/10/2022, 10:50 AM Certified in Neurology, Neurophysiology and Neuroimaging  [facility] [address] [place], [place] [address] [phone]

## 2022-08-15 ENCOUNTER — Telehealth: Payer: Self-pay | Admitting: Psychiatry

## 2022-08-15 NOTE — Telephone Encounter (Signed)
Pt LVM @ 9:03a.  She is asking if she can switch from Fluoxetine to Paxil.  Also she stated she could not take the Wellbutrin.  Nex appt 5/29

## 2022-08-16 ENCOUNTER — Other Ambulatory Visit: Payer: Self-pay

## 2022-08-16 NOTE — Telephone Encounter (Signed)
We have already changed meds once over the phone and it didn't work.  I don't want to start another med over the phone.  She is very med sensitive.   Stop the fluoxetine now.  It will taper itself out without withdrawal.  I need her off this before starting another med.    I do have several other options but I see her in 2 weeks.  We will start something then.

## 2022-08-16 NOTE — Telephone Encounter (Signed)
Patient doesn't feel the fluoxetine is working and is asking to switch to Paxil. She doesn't remember being on it previously, but it is in Epic medication history. 06/2017-05/2018. It is listed as historical so I'm not sure who prescribed it. It is marked as change in therapy. She wasn't able to tolerate Abilify. Thought it would help the fluoxetine work better.  She reports red eyes after 3 days on Wellbutrin. She reports depression is 10/10, became tearful during the conversation. She is sleeping, up and down during the night, but is able to get back to sleep fairly quickly. Denies SI, but "doesn't want to be here."  She doesn't want to go out of the house.

## 2022-08-17 NOTE — Telephone Encounter (Signed)
Patient notified of recommendations. 

## 2022-08-29 ENCOUNTER — Other Ambulatory Visit: Payer: Self-pay | Admitting: Psychiatry

## 2022-08-29 DIAGNOSIS — F411 Generalized anxiety disorder: Secondary | ICD-10-CM

## 2022-08-29 DIAGNOSIS — F331 Major depressive disorder, recurrent, moderate: Secondary | ICD-10-CM

## 2022-08-29 DIAGNOSIS — F900 Attention-deficit hyperactivity disorder, predominantly inattentive type: Secondary | ICD-10-CM

## 2022-08-29 DIAGNOSIS — F5105 Insomnia due to other mental disorder: Secondary | ICD-10-CM

## 2022-08-31 ENCOUNTER — Ambulatory Visit (INDEPENDENT_AMBULATORY_CARE_PROVIDER_SITE_OTHER): Payer: Medicare HMO | Admitting: Psychiatry

## 2022-08-31 ENCOUNTER — Encounter: Payer: Self-pay | Admitting: Psychiatry

## 2022-08-31 DIAGNOSIS — F339 Major depressive disorder, recurrent, unspecified: Secondary | ICD-10-CM

## 2022-08-31 DIAGNOSIS — F5105 Insomnia due to other mental disorder: Secondary | ICD-10-CM

## 2022-08-31 DIAGNOSIS — F411 Generalized anxiety disorder: Secondary | ICD-10-CM

## 2022-08-31 DIAGNOSIS — F900 Attention-deficit hyperactivity disorder, predominantly inattentive type: Secondary | ICD-10-CM | POA: Diagnosis not present

## 2022-08-31 MED ORDER — PAROXETINE HCL 10 MG/5ML PO SUSP: ORAL | 0 refills | Status: DC

## 2022-08-31 NOTE — Patient Instructions (Addendum)
TMS option , transcranial magnetic stimulation Call 3rd week of June if not better and we'll increase the dose

## 2022-08-31 NOTE — Progress Notes (Signed)
Alyssa Vaughn [id number] 05-07-1950 72 y.o.    Subjective:   Patient ID:  Alyssa Vaughn is a 72 y.o. (DOB [birth year]) female.  Chief Complaint:  Chief Complaint  Patient presents with   Follow-up   Depression   Anxiety    Anxiety Symptoms include decreased concentration. Patient reports no chest pain, confusion, nervous/anxious behavior, palpitations or suicidal ideas.    Depression        Associated symptoms include decreased concentration.  Associated symptoms include no suicidal ideas.  Past medical history includes anxiety.    [name] presents to the office today for follow-up of increased fluoxetine for worsening depression without reason.    seen October 2020.  No meds were changed.  Fluoxetine 20 mg was continued.  07/09/2019 appointment the following is noted: Wants to try Quillivent bc easily confused, distracted and unfocused as noted. Good overall.  Fluoxetine helps anxiety and tears. Only taking Xanax 1/2 tablet at night.   Still ADD and head does not feel awake.  Hard to concentrate with groups.  But also doesn't feel quite awake and alert usually.   Feel like my brain won't wake up.  Tried modafinil 50 mg but nervous so much couldn't evaluate effect on ADD.  Has tried lots of ADD meds and didn't tolerate them. Grand kids in best.  [name] murdered but he hasn't been in the picture.  Pleased she's been active.  Not generally self-motivated and pleased she's stayed busy. 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  I just have to let it go.   Can't focus on Kila.  She periodically struggles with sadness over the brokenness in the relationship.  Recognizes she craves his attention.  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  Don't need clonazepam at any other time.  Patient denies difficulty with sleep initiation or maintenance. 7-8 hours.  Denies appetite disturbance.   Patient reports that energy and motivation have been good.  Patient denies any suicidal ideation.  No longer depressed usually and anxious but episodic.  Klonopin good once daily for anxiety.  Xanax for sleep ok. Plan: Start Quillivant 1 ml daily and may increase by 1 ml weekly as tolerated up to about 3-4 ml  09/03/2019 phone call that the [name] was too expensive and wanted a prescription for Ritalin 5 mg which was sent.  09/09/2019 appointment with the following noted: Ritalin helpful.  takiing up to BID and it makes a big difference in focus and productivity. Tolerating it well.   Taking a little alprazolam at night usually about 1/4 of 0.5 mg night.y.  Not taking clonazepam. Asks about taper BZ. Overall feels the best she's been.  After years of divorced she feels more free. Less fear spiritually now. No med changes  03/10/20 appt with following noted: Not taking stimulants. Taking 0.125 mg alprazolam HS.  Makes her sleepy a little.  Not sure if she needs it.  Wonders about stopping .   Overall good and bad.  Up and down mood with a lot of back problems which she's working on.  Sometimes unmanageable. Low days fight with herself, tired and uninterested.  Stress and obsess over Pearson. Divorced 2008 and still not over it.  Still no life for herself. Don't get involved with things.  Only looks forward to popcorn and going to bed early.  Everything is drudgery and stay unmotivated. These sx come and go.  It's always there in the background but waxes and wanes.   Working on it. Plan no changes and continue fluoxetine and Ritalin.  06/02/2020 appointment with following noted: Not taking Ritalin.  Continued fluoxetine.   Tried to address her issues with [name].  Other stuff still there with cry9ing and hard to focus and get through the day.  Weary of trying meds.  Trying to work on herself with self help.  Tried diet and exercise without change in sx.  Fear of going out witiut a reason.  Feels  stuck. Last filled Ritalin Augst 2021. Still depressed.  Struggles to get things done. Recent normal physical.  Plan:  Potentiate Abilify 2 mg tablet 1/2 daily for 2 weeks, then 1 tablet for sig depression  07/21/2020 appt noted: Good with aripiprazole it helped a lot noticeably, smooth Taking Abilify 2 mg daily. Got rid of heavy feeling of depresssion.  No crying.  Easier to do things and go places No SE with meds. Restarted Ritalin 5 mg TID too low.  Wants ER. Needs equivalent of 10 TID.  Helps the confusion of the day and improves focus and completion.  Good combo with Abilify. Plan:  Switch to LA Concerta 36 mg AM.  Call if too much.  09/01/2020 appointment noted: She called a couple times since being here and found both the 36 mg size and the 27 mg size of Concerta to be "too much" including symptoms of tension and anxiety and wanted to switch back to the Ritalin 5 mg 3 times daily.  She was offered the option of going to the lowest dose of Concerta 18 mg but declined. Ritalin 5 TID not very effective.  Would like to try lower dose of Concerta. Mood still very good and unusual for me.  Benefit Abilify.  Working on herself.   Plan for depression recommend potentiation with Abilify up to 2 mg daily.  Sleep is good.    03/03/2021 appointment with the following noted: Not taking Ritalin DT SE and up/down feelings from it.  It is helpful but not necessary. Saw benefit with Abilify in the beginning but didn't feel like herself.  Helped the depression and easier to go out and do things. Took the Abilify for 3-4 mos.  , Overall doing OK.  Better situation in the family with good grandparenting with [name] and does things with [place] socially and it helped.  Better sense of focus and direction for her life and less demanding of him.  Good relationship with [name] and the family.  Settling more into retirement.  Anxiety manageable. Exercise more consistent.  07/28/21 appt noted: Taking  fluoxetine 20 and Ritalin 5 mg TID. Without Ritalin sliding back into cycle of sadness, crying, poor focus.  Started Ritalin back and more motivation and concentration.. Moved appt up. Still struggles with sadness and low interest and energy and low self esteem.  No SI.  Persistent depression and she fights it with diet and exercise and activity.  Feels heavy Asked about retring Wellbutrin Plan: Reduce fluoxetine to 10 mg daily.  Call if any SE.  Add wellbutrin XL 150 mg daily for depression and stop stimulant.  08/24/21 appt noted: It failed.  Couldn't tolerate it for a week bc dry eyes re: wellbutrin. So decided to retry Abilify for mood. 2 mg daily and increased fluoxetine 20 mg daily. More steady and less down and more motivated with Abilify 2 daily.   SE a little foggy and hope for no weight gain.  Ritalin a little drying but asks about retrying dextrostat.  Not taking stimulant currently.  +12/01/21 appt noted: Loistine Chance has Covid and she was exposed. Has continued fluoxetine 20 mg daily without Abilify.  Stopped it DT tremor R hand. Added Ritalin 5 mg BID is usual.  Seems adequate duration.  Leveled things out with distraction, attn is much better.  Able to calm down and be present.  Tolerating it fine so far.  Using the generic from [facility] and tolerates it better than others she takes.  Yellow and the white one have been tolerated.   Mood is good  though initially worse off Abilify.  No crying now.  Mood smoother on Abilify. Plan: Still takes Xanax 0.125-0.25 HS Continue fluoxetine 20 mg daily.  Call if any SE.  hold Abilify 2 mg daily helped depression but tremor.  Consider Rexulti if needed Continue Ritalin 5 mg BID-TID  05/11/22 appt noted: Doing ok since then. Nov eyes got dry with Ritalin and stopped and retried.  Off for a month. Calmer and more centered in present and motivated with stimulant than without it.  Also less blah. Mood a little down but overall steady. Frustrated with  some medical problems and back pain.   No concerns with current meds.   Sleep is good overall except for rib pain lately. Plan: Still takes Xanax 0.125-0.25 HS Continue fluoxetine 20 mg daily.  Call if any SE.  hold Abilify 2 mg daily helped depression but tremor.   Trial modafinil 50 mg AM  08/02/22  appt noted: No modafinil DT HA. Not taking dextrostat DT heart racing even at 2.5 mg. More crying lately for 2 mos.  Hard to get through the day.  Heavy feeling and can't see reason for the crying.   Stimulant helped motivation but mood is worse off it. Decided prozac not working and reduced it to 10 mg daily. She's been researching and thinks what she is missing is DA.  Taking Velvet bean OTC which is supposed to help but no effect noted.   Plan: Disc off label for depression and SE: Pramipexole 1/2 tablet in the AM for 3 days,  Then 1 tablet each AM for 3 days,  Then 1 in the AM and 1/2 tablet in PM for 3 days, Then 1 twice daily  5/29/234 appt noted: Didn't tolerate the pramipexole at 0.25 mg tablet bc dizziness. Then called and asked to start Wellbutrin which caused HA and dry eyes. On no psych med at this time.  Stopped fluoxetine for a couple of weeks. Asks about Paxil retrial but didn't tolerate it before. Dep, sad, cry, wants to isolate, reduced interest, anxious.  No SI   Past Psychiatric Medication Trials: Modafinil SE, methylphenidate tablets,  , Dextrostat, Adderall, Ritalin, Vyvanse, Evekeo, Concerta 27 SE, Ritalin 5 mg BID tolerated mfg [employer], dry eyes Modafinil 50 HA Strattera, Deplin,  Bupropion 37.5 mg SE HA and dry eyes Fluoxetine 20, 10 NR, Lexapro, sertraline 37.5 side effects, venlafaxine, duloxetine, Pristiq, paroxetine, citalopram,  Pramipexole 0.25 mg dizzy buspirone,  Abilify 2 marked benefit but CO some SE spacy and stopped. Clonazepam sed, Xanax,    Hydroxyzine Last counseling years ago.  Review of Systems:  Review of Systems  Cardiovascular:   Negative for chest pain and palpitations.  Musculoskeletal:  Positive for back pain.  Neurological:  Negative for tremors and weakness.  Psychiatric/Behavioral:  Positive for decreased concentration and dysphoric mood. Negative for agitation, behavioral problems, confusion, hallucinations, self-injury, sleep disturbance and  suicidal ideas. The patient is not nervous/anxious and is not hyperactive.     Medications: I have reviewed the patient's current medications.  Current Outpatient Medications  Medication Sig Dispense Refill   ALPRAZolam (XANAX) 0.5 MG tablet TAKE 1/2 TO 1 TABLET BY MOUTH AT BEDTIME AS NEEDED FOR ANXIETY OR SLEEP 30 tablet 5   Omega-3 1000 MG CAPS Take by mouth.     paroxetine (PAXIL) 10 MG/5ML suspension 1 ml daily for 1 week then 2 ml daily 250 mL 0   vitamin C (ASCORBIC ACID) 500 MG tablet Take 500 mg by mouth daily.     buPROPion (WELLBUTRIN) 75 MG tablet Take 0.5 tablets (37.5 mg total) by mouth 2 (two) times daily. (Patient not taking: Reported on 08/31/2022) 30 tablet 1   FLUoxetine (PROZAC) 20 MG capsule Take 1 capsule (20 mg total) by mouth daily. (Patient not taking: Reported on 08/31/2022) 90 capsule 1   Ivermectin (SOOLANTRA) 1 % CREA Apply 1 application topically daily. (Patient not taking: Reported on 08/31/2022) 30 g 2   No current facility-administered medications for this visit.    Medication Side Effects: None  Allergies:  Allergies  Allergen Reactions   Biaxin [Clarithromycin]     Upset stomach   Nsaids Rash    Past Medical History:  Diagnosis Date   Arthritis     History reviewed. No pertinent family history.  History, Surgical history, Social history, and Family history were reviewed and updated as appropriate.   Please see review of systems for further details on the patient's review from today.   Objective:   Physical Exam:  There were no vitals taken for this visit.  Physical Exam Constitutional:      General: She is not in  acute distress.    Appearance: She is well-developed.  Musculoskeletal:        General: No deformity.  Neurological:     Mental Status: She is alert and oriented to person, place, and time.     Motor: No tremor.     Coordination: Coordination normal.     Gait: Gait normal.  Psychiatric:        Attention and Perception: Perception normal. She is attentive.        Mood and Affect: Mood is anxious and depressed. Affect is blunt. Affect is not labile, angry, tearful or inappropriate.        Speech: Speech normal.        Behavior: Behavior normal.        Thought Content: Thought content normal. Thought content is not delusional. Thought content does not include homicidal or suicidal ideation. Thought content does not include suicidal plan.        Cognition and Memory: Cognition normal.        Judgment: Judgment normal.     Comments: Insight and judgment good. No auditory or visual hallucinations. No delusions.         Lab Review:     Component Value Date/Time   NA 143 09/05/2017 1535   K 3.7 09/05/2017 1535   CL 106 09/05/2017 1535   CO2 29 09/05/2017 1535   GLUCOSE 99 09/05/2017 1535   BUN 14 09/05/2017 1535   CREATININE 0.69 09/05/2017 1535   CALCIUM 9.9 09/05/2017 1535   GFRNONAA >60 09/05/2017 1535   GFRAA >60 09/05/2017 1535       Component Value Date/Time   WBC 7.1 09/05/2017 1535   RBC 4.71 09/05/2017 1535   HGB 14.5 09/05/2017 1535  HCT 42.6 09/05/2017 1535   PLT 206 09/05/2017 1535   MCV 90.4 09/05/2017 1535   MCH 30.8 09/05/2017 1535   MCHC 34.0 09/05/2017 1535   RDW 11.6 09/05/2017 1535   LYMPHSABS 2.1 01/11/2009 1400   MONOABS 0.7 01/11/2009 1400   EOSABS 0.0 01/11/2009 1400   BASOSABS 0.0 01/11/2009 1400    No results found for: "POCLITH", "LITHIUM"   No results found for: "PHENYTOIN", "PHENOBARB", "VALPROATE", "CBMZ"   .res Assessment: Plan:    Recurrent major depression resistant to treatment (HCC) - Plan: paroxetine (PAXIL) 10 MG/5ML  suspension  Generalized anxiety disorder - Plan: paroxetine (PAXIL) 10 MG/5ML suspension  Attention deficit hyperactivity disorder (ADHD), predominantly inattentive type  Insomnia due to mental condition   Greater than 50% of 30 min face to face time with patient was spent on counseling and coordination of care. We discussed overall depression better .  Problems with ADD  Rec counseling.   Disc options. Lorenda Cahill PhD.   Consider alternative Trintellix but it might not be affordable.  She wants to maybe retry paroxetine.  More likely benefit from something that is different from what she didn't tolerate before.  Consider augmentation with Rexulti DT benefit but SE Abilify  Disc med sensitivity, discussed her concerns in general fearfulness around medications.  hold Abilify 2 mg daily helped depression but tremor.  Consider Rexulti if needed  More ADD sx without stimulant.  Few other options other than modafinil off label.  Unless she gets eye doctor to solve dry eyes.  She has disc this problem with them before.  Long history of dry eyes.  Discussed potential benefits, risks, and side effects of stimulants with patient to include increased heart rate, palpitations, insomnia, increased anxiety, increased irritability, or decreased appetite.  Instructed patient to contact office if experiencing any significant tolerability issues. Failed to tolerate Concerta 36 and 27 mg.  Has the option of trying the 18 mg. Failed multiple stimulants DT tolerability px.  Probably can't afford liquid stimulant which is the only option left.  Option retry Dextrostat but she wishes to defer. Continue Ritalin   We discussed the short-term risks associated with benzodiazepines including sedation and increased fall risk among others.  Discussed long-term side effect risk including dependence, potential withdrawal symptoms, and the potential eventual dose-related risk of dementia.  Disc unusual combo stimulants but  is serving good medical purpose.  Still takes Xanax 0.125-0.25 HS   Option she never tried:  (NAC) N-Acetylcysteine 2 of the  600 mg capsules daily to help with mild cognitive problems.  It can be combined with a B-complex vitamin as the B-12 and folate which can sometimes enhance the effect.  Disc option of TMS extensively.  She doesn't want to do this now.  Plan paroxetine susp 10mg /24ml 1 ml daily for 1 week then 2 ml daily Call 3rd week of June if not better and we'll increase the dose  FU 6 weeks  Meredith Staggers, MD, DFAPA   Please see After Visit Summary for patient specific instructions.  Future Appointments  Date Time Provider Department Center  11/09/2022  9:00 AM Cottle, Steva Ready., MD [facility] None     No orders of the defined types were placed in this encounter.     -------------------------------

## 2022-09-02 ENCOUNTER — Telehealth: Payer: Self-pay | Admitting: Psychiatry

## 2022-09-02 ENCOUNTER — Other Ambulatory Visit: Payer: Self-pay | Admitting: Psychiatry

## 2022-09-02 MED ORDER — PAROXETINE HCL 10 MG PO TABS: 5.0000 mg | ORAL_TABLET | Freq: Every day | ORAL | 0 refills | Status: DC

## 2022-09-02 NOTE — Telephone Encounter (Signed)
Pt LVM @ 9:03a.  She said the liquid Paroxetine is $141.  She can't afford it.  She is asking if you wills end in a low dose tablet or capsule instead.  Next appt 7/10

## 2022-09-02 NOTE — Telephone Encounter (Signed)
Patient notified of Rx and directions.

## 2022-09-02 NOTE — Telephone Encounter (Signed)
Please see message from patient.  Using [facility] at [facility] cost was $53.00. Patient said that is still too much and she would like a low dose tablet.

## 2022-09-02 NOTE — Telephone Encounter (Signed)
Sent Tablet paroxetine 10 mg 1/2 daily. She is likely to be intolerant but she wants to try it anyway

## 2022-09-07 DIAGNOSIS — H16223 Keratoconjunctivitis sicca, not specified as Sjogren's, bilateral: Secondary | ICD-10-CM | POA: Diagnosis not present

## 2022-09-23 ENCOUNTER — Telehealth: Payer: Self-pay | Admitting: Psychiatry

## 2022-09-23 NOTE — Telephone Encounter (Signed)
Pt LVM @ 9:45a.  She said Dr Jennelle Human started her on Paxil 5mg .  It is "going fine".  She has about 5 days left.  She is not having any side effects.  She wants to know if he will possibly increase the dosage and send a script to [facility] on [address].  Next appt 7/10

## 2022-09-23 NOTE — Telephone Encounter (Signed)
Patient reporting that the Paxil 5 mg has been effective for the crying spells and overall has gotten her out of the "dark spot." She would like to increase dose to 10 mg. Reporting no SE with the 5 mg dose, which was started on 5/31. Has FU 7/10.

## 2022-09-27 ENCOUNTER — Other Ambulatory Visit: Payer: Self-pay | Admitting: Psychiatry

## 2022-09-29 ENCOUNTER — Other Ambulatory Visit: Payer: Self-pay | Admitting: Psychiatry

## 2022-10-10 DIAGNOSIS — H04129 Dry eye syndrome of unspecified lacrimal gland: Secondary | ICD-10-CM | POA: Diagnosis not present

## 2022-10-10 DIAGNOSIS — H2513 Age-related nuclear cataract, bilateral: Secondary | ICD-10-CM | POA: Diagnosis not present

## 2022-10-11 DIAGNOSIS — Z8619 Personal history of other infectious and parasitic diseases: Secondary | ICD-10-CM | POA: Diagnosis not present

## 2022-10-11 DIAGNOSIS — E559 Vitamin D deficiency, unspecified: Secondary | ICD-10-CM | POA: Diagnosis not present

## 2022-10-11 DIAGNOSIS — E538 Deficiency of other specified B group vitamins: Secondary | ICD-10-CM | POA: Diagnosis not present

## 2022-10-11 DIAGNOSIS — E7800 Pure hypercholesterolemia, unspecified: Secondary | ICD-10-CM | POA: Diagnosis not present

## 2022-10-11 DIAGNOSIS — Z79899 Other long term (current) drug therapy: Secondary | ICD-10-CM | POA: Diagnosis not present

## 2022-10-12 ENCOUNTER — Encounter: Payer: Self-pay | Admitting: Psychiatry

## 2022-10-12 ENCOUNTER — Ambulatory Visit (INDEPENDENT_AMBULATORY_CARE_PROVIDER_SITE_OTHER): Payer: Medicare HMO | Admitting: Psychiatry

## 2022-10-12 DIAGNOSIS — F339 Major depressive disorder, recurrent, unspecified: Secondary | ICD-10-CM | POA: Diagnosis not present

## 2022-10-12 DIAGNOSIS — F411 Generalized anxiety disorder: Secondary | ICD-10-CM

## 2022-10-12 DIAGNOSIS — F900 Attention-deficit hyperactivity disorder, predominantly inattentive type: Secondary | ICD-10-CM

## 2022-10-12 DIAGNOSIS — F5105 Insomnia due to other mental disorder: Secondary | ICD-10-CM | POA: Diagnosis not present

## 2022-10-12 NOTE — Progress Notes (Signed)
Alyssa Vaughn [id number] 1950/11/24 72 y.o.    Subjective:   Patient ID:  Alyssa Vaughn is a 72 y.o. (DOB 1951/02/01) female.  Chief Complaint:  Chief Complaint  Patient presents with   Follow-up   Depression   Anxiety   ADD   Medication Reaction    Anxiety Symptoms include decreased concentration. Patient reports no chest pain, confusion, nervous/anxious behavior, palpitations or suicidal ideas.    Depression        Associated symptoms include decreased concentration.  Associated symptoms include no suicidal ideas.  Past medical history includes anxiety.    Alyssa Vaughn presents to the office today for follow-up of increased fluoxetine for worsening depression without reason.    seen October 2020.  No meds were changed.  Fluoxetine 20 mg was continued.  07/09/2019 appointment the following is noted: Wants to try Quillivent bc easily confused, distracted and unfocused as noted. Good overall.  Fluoxetine helps anxiety and tears. Only taking Xanax 1/2 tablet at night.   Still ADD and head does not feel awake.  Hard to concentrate with groups.  But also doesn't feel quite awake and alert usually.   Feel like my brain won't wake up.  Tried modafinil 50 mg but nervous so much couldn't evaluate effect on ADD.  Has tried lots of ADD meds and didn't tolerate them. Grand kids in best.  Alyssa Vaughn murdered but he hasn't been in the picture.  Pleased she's been active.  Not generally self-motivated and pleased she's stayed busy. 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  I just have to let it go.   Can't focus on Alyssa Vaughn.  She periodically struggles with sadness over the brokenness in the relationship.  Recognizes she craves his attention.  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  Don't need clonazepam at any other time.  Patient denies difficulty with sleep initiation or maintenance. 7-8 hours.   Denies appetite disturbance.  Patient reports that energy and motivation have been good.  Patient denies any suicidal ideation.  No longer depressed usually and anxious but episodic.  Klonopin good once daily for anxiety.  Xanax for sleep ok. Plan: Start Quillivant 1 ml daily and may increase by 1 ml weekly as tolerated up to about 3-4 ml  09/03/2019 phone call that [name] was too expensive and wanted a prescription for Ritalin 5 mg which was sent.  09/09/2019 appointment with the following noted: Ritalin helpful.  takiing up to BID and it makes a big difference in focus and productivity. Tolerating it well.   Taking a little alprazolam at night usually about 1/4 of 0.5 mg night.y.  Not taking clonazepam. Asks about taper BZ. Overall feels the best she's been.  After years of divorced she feels more free. Less fear spiritually now. No med changes  03/10/20 appt with following noted: Not taking stimulants. Taking 0.125 mg alprazolam HS.  Makes her sleepy a little.  Not sure if she needs it.  Wonders about stopping .   Overall good and bad.  Up and down mood with a lot of back problems which she's working on.  Sometimes unmanageable. Low days fight with herself, tired and uninterested.  Stress and obsess over Alyssa Vaughn. Divorced 2008 and still not over it.  Still no life for herself. Don't get involved with things.  Only looks forward to popcorn and going to bed early.  Everything is drudgery and stay  unmotivated. These sx come and go.  It's always there in the background but waxes and wanes.   Working on it. Plan no changes and continue fluoxetine and Ritalin.  06/02/2020 appointment with following noted: Not taking Ritalin.  Continued fluoxetine.   Tried to address her issues with Alyssa Vaughn.  Other stuff still there with cry9ing and hard to focus and get through the day.  Weary of trying meds.  Trying to work on herself with self help.  Tried diet and exercise without change in sx.  Fear of going  out witiut a reason.  Feels stuck. Last filled Ritalin Augst 2021. Still depressed.  Struggles to get things done. Recent normal physical.  Plan:  Potentiate Abilify 2 mg tablet 1/2 daily for 2 weeks, then 1 tablet for sig depression  07/21/2020 appt noted: Good with aripiprazole it helped a lot noticeably, smooth Taking Abilify 2 mg daily. Got rid of heavy feeling of depresssion.  No crying.  Easier to do things and go places No SE with meds. Restarted Ritalin 5 mg TID too low.  Wants ER. Needs equivalent of 10 TID.  Helps the confusion of the day and improves focus and completion.  Good combo with Abilify. Plan:  Switch to LA Concerta 36 mg AM.  Call if too much.  09/01/2020 appointment noted: She called a couple times since being here and found both the 36 mg size and the 27 mg size of Concerta to be "too much" including symptoms of tension and anxiety and wanted to switch back to the Ritalin 5 mg 3 times daily.  She was offered the option of going to the lowest dose of Concerta 18 mg but declined. Ritalin 5 TID not very effective.  Would like to try lower dose of Concerta. Mood still very good and unusual for me.  Benefit Abilify.  Working on herself.   Plan for depression recommend potentiation with Abilify up to 2 mg daily.  Sleep is good.    03/03/2021 appointment with the following noted: Not taking Ritalin DT SE and up/down feelings from it.  It is helpful but not necessary. Saw benefit with Abilify in the beginning but didn't feel like herself.  Helped the depression and easier to go out and do things. Took the Abilify for 3-4 mos.  , Overall doing OK.  Better situation in the family with good grandparenting with Alyssa Vaughn and does things with Alyssa Vaughn socially and it helped.  Better sense of focus and direction for her life and less demanding of him.  Good relationship with Alyssa Vaughn and the family.  Settling more into retirement.  Anxiety manageable. Exercise more consistent.  07/28/21  appt noted: Taking fluoxetine 20 and Ritalin 5 mg TID. Without Ritalin sliding back into cycle of sadness, crying, poor focus.  Started Ritalin back and more motivation and concentration.. Moved appt up. Still struggles with sadness and low interest and energy and low self esteem.  No SI.  Persistent depression and she fights it with diet and exercise and activity.  Feels heavy Asked about retring Wellbutrin Plan: Reduce fluoxetine to 10 mg daily.  Call if any SE.  Add wellbutrin XL 150 mg daily for depression and stop stimulant.  08/24/21 appt noted: It failed.  Couldn't tolerate it for a week bc dry eyes re: wellbutrin. So decided to retry Abilify for mood. 2 mg daily and increased fluoxetine 20 mg daily. More steady and less down and more motivated with Abilify 2 daily.   SE a little  foggy and hope for no weight gain. Ritalin a little drying but asks about retrying dextrostat.  Not taking stimulant currently.  +12/01/21 appt noted: Loistine Chance has Covid and she was exposed. Has continued fluoxetine 20 mg daily without Abilify.  Stopped it DT tremor R hand. Added Ritalin 5 mg BID is usual.  Seems adequate duration.  Leveled things out with distraction, attn is much better.  Able to calm down and be present.  Tolerating it fine so far.  Using the generic from [facility] and tolerates it better than others she takes.  Yellow and the white one have been tolerated.   Mood is good  though initially worse off Abilify.  No crying now.  Mood smoother on Abilify. Plan: Still takes Xanax 0.125-0.25 HS Continue fluoxetine 20 mg daily.  Call if any SE.  hold Abilify 2 mg daily helped depression but tremor.  Consider Rexulti if needed Continue Ritalin 5 mg BID-TID  05/11/22 appt noted: Doing ok since then. Nov eyes got dry with Ritalin and stopped and retried.  Off for a month. Calmer and more centered in present and motivated with stimulant than without it.  Also less blah. Mood a little down but overall  steady. Frustrated with some medical problems and back pain.   No concerns with current meds.   Sleep is good overall except for rib pain lately. Plan: Still takes Xanax 0.125-0.25 HS Continue fluoxetine 20 mg daily.  Call if any SE.  hold Abilify 2 mg daily helped depression but tremor.   Trial modafinil 50 mg AM  08/02/22  appt noted: No modafinil DT HA. Not taking dextrostat DT heart racing even at 2.5 mg. More crying lately for 2 mos.  Hard to get through the day.  Heavy feeling and can't see reason for the crying.   Stimulant helped motivation but mood is worse off it. Decided prozac not working and reduced it to 10 mg daily. She's been researching and thinks what she is missing is DA.  Taking Velvet bean OTC which is supposed to help but no effect noted.   Plan: Disc off label for depression and SE: Pramipexole 1/2 tablet in the AM for 3 days,  Then 1 tablet each AM for 3 days,  Then 1 in the AM and 1/2 tablet in PM for 3 days, Then 1 twice daily  5/29/234 appt noted: Didn't tolerate the pramipexole at 0.25 mg tablet bc dizziness. Then called and asked to start Wellbutrin which caused HA and dry eyes. On no psych med at this time.  Stopped fluoxetine for a couple of weeks. Asks about Paxil retrial but didn't tolerate it before. Dep, sad, cry, wants to isolate, reduced interest, anxious.  No SI Plan; Plan paroxetine susp 10mg /34ml 1 ml daily for 1 week then 2 ml daily Call 3rd week of June if not better and we'll increase the dose  09/23/22 TC:  Patient reporting that the Paxil 5 mg has been effective for the crying spells and overall has gotten her out of the "dark spot." She would like to increase dose to 10 mg. Reporting no SE with the 5 mg dose, which was started on 5/31. Has FU 7/10.      10/12/2022 appointment noted: She reduced the dose paroxetine to 5 mg daily from 10 mg DT dry eyes.  But she thinks it might make her irritable.  Only took 10 mg a couple of days.   Eye  doc dx ocular rosacea.  Given some drops  but hasn't started all the drops yet.  One more new drop coming Monday which might help eyes. Now worries med might be causing rosacea. Now ambivalent about the paroxetine. She thought paroxetine helped dep and crying initially but it has returned again.  No reason for the depression.    Past Psychiatric Medication Trials: Modafinil SE, methylphenidate tablets,  , Dextrostat, Adderall, Ritalin, Vyvanse, Evekeo, Concerta 27 SE, Ritalin 5 mg BID tolerated mfg [employer], dry eyes Modafinil 50 HA Strattera,  Deplin,   Bupropion 37.5 mg SE HA and dry eyes Fluoxetine 20, 10 NR, Lexapro, sertraline 37.5 side effects, venlafaxine, duloxetine, Pristiq, paroxetine, citalopram,   Pramipexole 0.25 mg dizzy buspirone,  Abilify 2 marked benefit but CO some SE spacy and stopped.  Clonazepam sed, Xanax,    Hydroxyzine Last counseling years ago.  Review of Systems:  Review of Systems  Cardiovascular:  Negative for chest pain and palpitations.  Musculoskeletal:  Positive for back pain.  Neurological:  Negative for tremors and weakness.  Psychiatric/Behavioral:  Positive for decreased concentration and dysphoric mood. Negative for agitation, behavioral problems, confusion, hallucinations, self-injury, sleep disturbance and suicidal ideas. The patient is not nervous/anxious and is not hyperactive.     Medications: I have reviewed the patient's current medications.  Current Outpatient Medications  Medication Sig Dispense Refill   ALPRAZolam (XANAX) 0.5 MG tablet TAKE 1/2 TO 1 TABLET BY MOUTH AT BEDTIME AS NEEDED FOR ANXIETY OR SLEEP 30 tablet 5   Omega-3 1000 MG CAPS Take by mouth.     PARoxetine (PAXIL) 10 MG tablet Take 1 tablet (10 mg total) by mouth daily. (Patient taking differently: Take 5 mg by mouth daily.) 30 tablet 0   vitamin C (ASCORBIC ACID) 500 MG tablet Take 500 mg by mouth daily.     No current facility-administered medications for this  visit.    Medication Side Effects: None  Allergies:  Allergies  Allergen Reactions   Biaxin [Clarithromycin]     Upset stomach   Nsaids Rash    Past Medical History:  Diagnosis Date   Arthritis     History reviewed. No pertinent family history.  History, Surgical history, Social history, and Family history were reviewed and updated as appropriate.   Please see review of systems for further details on the patient's review from today.   Objective:   Physical Exam:  There were no vitals taken for this visit.  Physical Exam Constitutional:      General: She is not in acute distress.    Appearance: She is well-developed.  Musculoskeletal:        General: No deformity.  Neurological:     Mental Status: She is alert and oriented to person, place, and time.     Motor: No tremor.     Coordination: Coordination normal.     Gait: Gait normal.  Psychiatric:        Attention and Perception: Perception normal. She is attentive.        Mood and Affect: Mood is anxious and depressed. Affect is blunt. Affect is not labile, angry, tearful or inappropriate.        Speech: Speech normal.        Behavior: Behavior normal.        Thought Content: Thought content normal. Thought content is not delusional. Thought content does not include homicidal or suicidal ideation. Thought content does not include suicidal plan.        Cognition and Memory: Cognition normal.  Judgment: Judgment normal.     Comments: Insight and judgment good. No auditory or visual hallucinations. No delusions.         Lab Review:     Component Value Date/Time   NA 143 09/05/2017 1535   K 3.7 09/05/2017 1535   CL 106 09/05/2017 1535   CO2 29 09/05/2017 1535   GLUCOSE 99 09/05/2017 1535   BUN 14 09/05/2017 1535   CREATININE 0.69 09/05/2017 1535   CALCIUM 9.9 09/05/2017 1535   GFRNONAA >60 09/05/2017 1535   GFRAA >60 09/05/2017 1535       Component Value Date/Time   WBC 7.1 09/05/2017 1535    RBC 4.71 09/05/2017 1535   HGB 14.5 09/05/2017 1535   HCT 42.6 09/05/2017 1535   PLT 206 09/05/2017 1535   MCV 90.4 09/05/2017 1535   MCH 30.8 09/05/2017 1535   MCHC 34.0 09/05/2017 1535   RDW 11.6 09/05/2017 1535   LYMPHSABS 2.1 01/11/2009 1400   MONOABS 0.7 01/11/2009 1400   EOSABS 0.0 01/11/2009 1400   BASOSABS 0.0 01/11/2009 1400    No results found for: "POCLITH", "LITHIUM"   No results found for: "PHENYTOIN", "PHENOBARB", "VALPROATE", "CBMZ"   .res Assessment: Plan:    Recurrent major depression resistant to treatment (HCC)  Generalized anxiety disorder  Attention deficit hyperactivity disorder (ADHD), predominantly inattentive type  Insomnia due to mental condition   30 min face to face time with patient was spent on counseling and coordination of care. We discussed overall depression better .  Problems with chronic dep and ADD Disc med sensitivity, discussed her concerns in general fearfulness around medications.   Paroxetine helped initially but doesn't tolerate enough of it DT dry eyes.     Rec counseling.   Disc options. Lorenda Cahill PhD.   Consider alternative Trintellix but it might not be affordable.  Consider augmentation with Rexulti DT benefit but SE Abilify. hold Abilify 2 mg daily helped depression but tremor.  Consider Rexulti if needed  More ADD sx without stimulant.  Few other options other than modafinil off label.  Unless she gets eye doctor to solve dry eyes.  She has disc this problem with them before.  Long history of dry eyes. No current stimulant DT dry eys.  We discussed the short-term risks associated with benzodiazepines including sedation and increased fall risk among others.  Discussed long-term side effect risk including dependence, potential withdrawal symptoms, and the potential eventual dose-related risk of dementia.  Disc unusual combo stimulants but is serving good medical purpose.  Still takes Xanax 0.125-0.25 HS   Option she never  tried:  (NAC) N-Acetylcysteine 2 of the  600 mg capsules daily to help with mild cognitive problems.  It can be combined with a B-complex vitamin as the B-12 and folate which can sometimes enhance the effect.  Disc option of TMS extensively.  She doesn't want to do this now.  She wants to stop paroxetine DT dry eyes.  She worries it might worsen rosacea.  Stop it and don't start anything new until this is addressed.  Can restart paroxetine and go back to 10 mg daily.  Other option is Trintellix which is unlikely to cause dryness.  Gave samples 5 mg and disc se  FU 4 weeks  Meredith Staggers, MD, DFAPA   Please see After Visit Summary for patient specific instructions.  Future Appointments  Date Time Provider Department Center  11/09/2022  9:00 AM Cottle, Steva Ready., MD [facility] None  No orders of the defined types were placed in this encounter.     -------------------------------

## 2022-10-18 DIAGNOSIS — E538 Deficiency of other specified B group vitamins: Secondary | ICD-10-CM | POA: Diagnosis not present

## 2022-10-18 DIAGNOSIS — Z8619 Personal history of other infectious and parasitic diseases: Secondary | ICD-10-CM | POA: Diagnosis not present

## 2022-10-18 DIAGNOSIS — H9190 Unspecified hearing loss, unspecified ear: Secondary | ICD-10-CM | POA: Diagnosis not present

## 2022-10-18 DIAGNOSIS — Z0000 Encounter for general adult medical examination without abnormal findings: Secondary | ICD-10-CM | POA: Diagnosis not present

## 2022-10-18 DIAGNOSIS — E559 Vitamin D deficiency, unspecified: Secondary | ICD-10-CM | POA: Diagnosis not present

## 2022-10-18 DIAGNOSIS — Z79899 Other long term (current) drug therapy: Secondary | ICD-10-CM | POA: Diagnosis not present

## 2022-10-18 DIAGNOSIS — E7800 Pure hypercholesterolemia, unspecified: Secondary | ICD-10-CM | POA: Diagnosis not present

## 2022-11-01 DIAGNOSIS — H16223 Keratoconjunctivitis sicca, not specified as Sjogren's, bilateral: Secondary | ICD-10-CM | POA: Diagnosis not present

## 2022-11-09 ENCOUNTER — Ambulatory Visit: Payer: BC Managed Care – PPO | Admitting: Psychiatry

## 2022-11-15 ENCOUNTER — Other Ambulatory Visit: Payer: Self-pay | Admitting: Psychiatry

## 2022-11-15 DIAGNOSIS — F5105 Insomnia due to other mental disorder: Secondary | ICD-10-CM

## 2022-11-21 ENCOUNTER — Other Ambulatory Visit: Payer: Self-pay | Admitting: Psychiatry

## 2022-12-01 DIAGNOSIS — R69 Illness, unspecified: Secondary | ICD-10-CM | POA: Diagnosis not present

## 2022-12-07 ENCOUNTER — Ambulatory Visit (INDEPENDENT_AMBULATORY_CARE_PROVIDER_SITE_OTHER): Payer: Medicare HMO | Admitting: Psychiatry

## 2022-12-07 ENCOUNTER — Encounter: Payer: Self-pay | Admitting: Psychiatry

## 2022-12-07 DIAGNOSIS — F5105 Insomnia due to other mental disorder: Secondary | ICD-10-CM | POA: Diagnosis not present

## 2022-12-07 DIAGNOSIS — F339 Major depressive disorder, recurrent, unspecified: Secondary | ICD-10-CM | POA: Diagnosis not present

## 2022-12-07 DIAGNOSIS — F900 Attention-deficit hyperactivity disorder, predominantly inattentive type: Secondary | ICD-10-CM | POA: Diagnosis not present

## 2022-12-07 DIAGNOSIS — F411 Generalized anxiety disorder: Secondary | ICD-10-CM

## 2022-12-07 MED ORDER — ALPRAZOLAM 0.5 MG PO TABS: ORAL_TABLET | ORAL | 2 refills | Status: DC

## 2022-12-07 MED ORDER — PAROXETINE HCL 10 MG PO TABS: 10.0000 mg | ORAL_TABLET | Freq: Every day | ORAL | 1 refills | Status: DC

## 2022-12-07 NOTE — Progress Notes (Signed)
Alyssa Vaughn [id number] June 19, 1950 72 y.o.    Subjective:   Patient ID:  Alyssa Vaughn is a 72 y.o. (DOB 1950-07-13) female.  Chief Complaint:  Chief Complaint  Patient presents with   Follow-up   Depression   Anxiety   ADD    Anxiety Symptoms include decreased concentration. Patient reports no chest pain, confusion, nervous/anxious behavior, palpitations or suicidal ideas.    Depression        Associated symptoms include decreased concentration.  Associated symptoms include no suicidal ideas.  Past medical history includes anxiety.    [name] presents to the office today for follow-up of increased fluoxetine for worsening depression without reason.    seen October 2020.  No meds were changed.  Fluoxetine 20 mg was continued.  07/09/2019 appointment the following is noted: Wants to try Quillivent bc easily confused, distracted and unfocused as noted. Good overall.  Fluoxetine helps anxiety and tears. Only taking Xanax 1/2 tablet at night.   Still ADD and head does not feel awake.  Hard to concentrate with groups.  But also doesn't feel quite awake and alert usually.   Feel like my brain won't wake up.  Tried modafinil 50 mg but nervous so much couldn't evaluate effect on ADD.  Has tried lots of ADD meds and didn't tolerate them. Grand kids in best.  [name] murdered but he hasn't been in the picture.  Pleased she's been active.  Not generally self-motivated and pleased she's stayed busy. 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  I just have to let it go.   Can't focus on [place].  She periodically struggles with sadness over the brokenness in the relationship.  Recognizes she craves his attention.  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  Don't need clonazepam at any other time.  Patient denies difficulty with sleep initiation or maintenance. 7-8 hours.  Denies appetite  disturbance.  Patient reports that energy and motivation have been good.  Patient denies any suicidal ideation.  No longer depressed usually and anxious but episodic.  Klonopin good once daily for anxiety.  Xanax for sleep ok. Plan: Start Quillivant 1 ml daily and may increase by 1 ml weekly as tolerated up to about 3-4 ml  09/03/2019 phone call that the [name] was too expensive and wanted a prescription for Ritalin 5 mg which was sent.  09/09/2019 appointment with the following noted: Ritalin helpful.  takiing up to BID and it makes a big difference in focus and productivity. Tolerating it well.   Taking a little alprazolam at night usually about 1/4 of 0.5 mg night.y.  Not taking clonazepam. Asks about taper BZ. Overall feels the best she's been.  After years of divorced she feels more free. Less fear spiritually now. No med changes  03/10/20 appt with following noted: Not taking stimulants. Taking 0.125 mg alprazolam HS.  Makes her sleepy a little.  Not sure if she needs it.  Wonders about stopping .   Overall good and bad.  Up and down mood with a lot of back problems which she's working on.  Sometimes unmanageable. Low days fight with herself, tired and uninterested.  Stress and obsess over [place]. Divorced 2008 and still not over it.  Still no life for herself. Don't get involved with things.  Only looks forward to popcorn and going to bed early.  Everything is drudgery and stay unmotivated. These sx come  and go.  It's always there in the background but waxes and wanes.   Working on it. Plan no changes and continue fluoxetine and Ritalin.  06/02/2020 appointment with following noted: Not taking Ritalin.  Continued fluoxetine.   Tried to address her issues with [name].  Other stuff still there with cry9ing and hard to focus and get through the day.  Weary of trying meds.  Trying to work on herself with self help.  Tried diet and exercise without change in sx.  Fear of going out witiut a  reason.  Feels stuck. Last filled Ritalin Augst 2021. Still depressed.  Struggles to get things done. Recent normal physical.  Plan:  Potentiate Abilify 2 mg tablet 1/2 daily for 2 weeks, then 1 tablet for sig depression  07/21/2020 appt noted: Good with aripiprazole it helped a lot noticeably, smooth Taking Abilify 2 mg daily. Got rid of heavy feeling of depresssion.  No crying.  Easier to do things and go places No SE with meds. Restarted Ritalin 5 mg TID too low.  Wants ER. Needs equivalent of 10 TID.  Helps the confusion of the day and improves focus and completion.  Good combo with Abilify. Plan:  Switch to LA Concerta 36 mg AM.  Call if too much.  09/01/2020 appointment noted: She called a couple times since being here and found both the 36 mg size and the 27 mg size of Concerta to be "too much" including symptoms of tension and anxiety and wanted to switch back to the Ritalin 5 mg 3 times daily.  She was offered the option of going to the lowest dose of Concerta 18 mg but declined. Ritalin 5 TID not very effective.  Would like to try lower dose of Concerta. Mood still very good and unusual for me.  Benefit Abilify.  Working on herself.   Plan for depression recommend potentiation with Abilify up to 2 mg daily.  Sleep is good.    03/03/2021 appointment with the following noted: Not taking Ritalin DT SE and up/down feelings from it.  It is helpful but not necessary. Saw benefit with Abilify in the beginning but didn't feel like herself.  Helped the depression and easier to go out and do things. Took the Abilify for 3-4 mos.  , Overall doing OK.  Better situation in the family with good grandparenting with [name] and does things with [place] socially and it helped.  Better sense of focus and direction for her life and less demanding of him.  Good relationship with [name] and the family.  Settling more into retirement.  Anxiety manageable. Exercise more consistent.  07/28/21 appt  noted: Taking fluoxetine 20 and Ritalin 5 mg TID. Without Ritalin sliding back into cycle of sadness, crying, poor focus.  Started Ritalin back and more motivation and concentration.. Moved appt up. Still struggles with sadness and low interest and energy and low self esteem.  No SI.  Persistent depression and she fights it with diet and exercise and activity.  Feels heavy Asked about retring Wellbutrin Plan: Reduce fluoxetine to 10 mg daily.  Call if any SE.  Add wellbutrin XL 150 mg daily for depression and stop stimulant.  08/24/21 appt noted: It failed.  Couldn't tolerate it for a week bc dry eyes re: wellbutrin. So decided to retry Abilify for mood. 2 mg daily and increased fluoxetine 20 mg daily. More steady and less down and more motivated with Abilify 2 daily.   SE a little foggy and hope for  no weight gain. Ritalin a little drying but asks about retrying dextrostat.  Not taking stimulant currently.  +12/01/21 appt noted: Loistine Chance has Covid and she was exposed. Has continued fluoxetine 20 mg daily without Abilify.  Stopped it DT tremor R hand. Added Ritalin 5 mg BID is usual.  Seems adequate duration.  Leveled things out with distraction, attn is much better.  Able to calm down and be present.  Tolerating it fine so far.  Using the generic from [facility] and tolerates it better than others she takes.  Yellow and the white one have been tolerated.   Mood is good  though initially worse off Abilify.  No crying now.  Mood smoother on Abilify. Plan: Still takes Xanax 0.125-0.25 HS Continue fluoxetine 20 mg daily.  Call if any SE.  hold Abilify 2 mg daily helped depression but tremor.  Consider Rexulti if needed Continue Ritalin 5 mg BID-TID  05/11/22 appt noted: Doing ok since then. Nov eyes got dry with Ritalin and stopped and retried.  Off for a month. Calmer and more centered in present and motivated with stimulant than without it.  Also less blah. Mood a little down but overall  steady. Frustrated with some medical problems and back pain.   No concerns with current meds.   Sleep is good overall except for rib pain lately. Plan: Still takes Xanax 0.125-0.25 HS Continue fluoxetine 20 mg daily.  Call if any SE.  hold Abilify 2 mg daily helped depression but tremor.   Trial modafinil 50 mg AM  08/02/22  appt noted: No modafinil DT HA. Not taking dextrostat DT heart racing even at 2.5 mg. More crying lately for 2 mos.  Hard to get through the day.  Heavy feeling and can't see reason for the crying.   Stimulant helped motivation but mood is worse off it. Decided prozac not working and reduced it to 10 mg daily. She's been researching and thinks what she is missing is DA.  Taking Velvet bean OTC which is supposed to help but no effect noted.   Plan: Disc off label for depression and SE: Pramipexole 1/2 tablet in the AM for 3 days,  Then 1 tablet each AM for 3 days,  Then 1 in the AM and 1/2 tablet in PM for 3 days, Then 1 twice daily  5/29/234 appt noted: Didn't tolerate the pramipexole at 0.25 mg tablet bc dizziness. Then called and asked to start Wellbutrin which caused HA and dry eyes. On no psych med at this time.  Stopped fluoxetine for a couple of weeks. Asks about Paxil retrial but didn't tolerate it before. Dep, sad, cry, wants to isolate, reduced interest, anxious.  No SI Plan; Plan paroxetine susp 10mg /20ml 1 ml daily for 1 week then 2 ml daily Call 3rd week of June if not better and we'll increase the dose  09/23/22 TC:  Patient reporting that the Paxil 5 mg has been effective for the crying spells and overall has gotten her out of the "dark spot." She would like to increase dose to 10 mg. Reporting no SE with the 5 mg dose, which was started on 5/31. Has FU 7/10.      10/12/2022 appointment noted: She reduced the dose paroxetine to 5 mg daily from 10 mg DT dry eyes.  But she thinks it might make her irritable.  Only took 10 mg a couple of days.   Eye  doc dx ocular rosacea.  Given some drops but hasn't started all  the drops yet.  One more new drop coming Monday which might help eyes. Now worries med might be causing rosacea. Now ambivalent about the paroxetine. She thought paroxetine helped dep and crying initially but it has returned again.  No reason for the depression.   Plan:  She wants to stop paroxetine DT dry eyes.  She worries it might worsen rosacea.  Stop it and don't start anything new until this is addressed.  Can restart paroxetine and go back to 10 mg daily. Other option is Trintellix which is unlikely to cause dryness.  Gave samples 5 mg and disc se  12/07/22 appt noted: She reduced paroxetine to 5 mg daily.  Off it for a week or 2 then resumed paroxetine and 8/10 started paroxetine 10 mg daily.  Crying is better. Usually first thing that improves on med. Eyes are a separate issue.   Pushed herself to stay on paroxetine. Anxiety is ok and low lately.  Easier to accept things, less easily upset. Still some dep sx.  Still trouble with lacking Zest for living.  Low grade dep not looking forward to much but then likes it when doing it.   Still keeps GD 8 hours 3 days per week. Attends church.  Not oversleeping as much as she was.   Xanax 0.25 mg HS helps sleep.  Past Psychiatric Medication Trials: Modafinil SE, methylphenidate tablets,  , Dextrostat, Adderall, Ritalin, Vyvanse, Evekeo, Concerta 27 SE, Ritalin 5 mg BID tolerated mfg [employer], dry eyes Modafinil 50 HA Strattera,  Deplin,   Bupropion 37.5 mg SE HA and dry eyes Fluoxetine 20, 10 NR, Lexapro, sertraline 37.5 side effects,  Paroxetine10 , citalopram,  venlafaxine, duloxetine, Pristiq,  Very brief Trintellix 5  Pramipexole 0.25 mg dizzy buspirone,  Abilify 2 marked benefit but CO some SE spacy and stopped.  Clonazepam sed, Xanax,    Hydroxyzine Last counseling years ago.  Review of Systems:  Review of Systems  Cardiovascular:  Negative for chest pain  and palpitations.  Musculoskeletal:  Positive for back pain.  Neurological:  Negative for tremors and weakness.  Psychiatric/Behavioral:  Positive for decreased concentration and dysphoric mood. Negative for agitation, behavioral problems, confusion, hallucinations, self-injury, sleep disturbance and suicidal ideas. The patient is not nervous/anxious and is not hyperactive.     Medications: I have reviewed the patient's current medications.  Current Outpatient Medications  Medication Sig Dispense Refill   Omega-3 1000 MG CAPS Take by mouth.     vitamin C (ASCORBIC ACID) 500 MG tablet Take 500 mg by mouth daily.     ALPRAZolam (XANAX) 0.5 MG tablet TAKE 1/2 TO 1 (ONE-HALF TO ONE) TABLET BY MOUTH AT BEDTIME AS NEEDED FOR ANXIETY OR SLEEP 30 tablet 2   PARoxetine (PAXIL) 10 MG tablet Take 1 tablet (10 mg total) by mouth daily. 90 tablet 1   No current facility-administered medications for this visit.    Medication Side Effects: None  Allergies:  Allergies  Allergen Reactions   Biaxin [Clarithromycin]     Upset stomach   Nsaids Rash    Past Medical History:  Diagnosis Date   Arthritis     History reviewed. No pertinent family history.  History, Surgical history, Social history, and Family history were reviewed and updated as appropriate.   Please see review of systems for further details on the patient's review from today.   Objective:   Physical Exam:  There were no vitals taken for this visit.  Physical Exam Constitutional:  General: She is not in acute distress.    Appearance: She is well-developed.  Musculoskeletal:        General: No deformity.  Neurological:     Mental Status: She is alert and oriented to person, place, and time.     Motor: No tremor.     Coordination: Coordination normal.     Gait: Gait normal.  Psychiatric:        Attention and Perception: Perception normal. She is attentive.        Mood and Affect: Mood is depressed. Mood is not  anxious. Affect is blunt. Affect is not labile, angry, tearful or inappropriate.        Speech: Speech normal.        Behavior: Behavior normal.        Thought Content: Thought content normal. Thought content is not delusional. Thought content does not include homicidal or suicidal ideation. Thought content does not include suicidal plan.        Cognition and Memory: Cognition normal.        Judgment: Judgment normal.     Comments: Insight and judgment good. No auditory or visual hallucinations. No delusions.         Lab Review:     Component Value Date/Time   NA 143 09/05/2017 1535   K 3.7 09/05/2017 1535   CL 106 09/05/2017 1535   CO2 29 09/05/2017 1535   GLUCOSE 99 09/05/2017 1535   BUN 14 09/05/2017 1535   CREATININE 0.69 09/05/2017 1535   CALCIUM 9.9 09/05/2017 1535   GFRNONAA >60 09/05/2017 1535   GFRAA >60 09/05/2017 1535       Component Value Date/Time   WBC 7.1 09/05/2017 1535   RBC 4.71 09/05/2017 1535   HGB 14.5 09/05/2017 1535   HCT 42.6 09/05/2017 1535   PLT 206 09/05/2017 1535   MCV 90.4 09/05/2017 1535   MCH 30.8 09/05/2017 1535   MCHC 34.0 09/05/2017 1535   RDW 11.6 09/05/2017 1535   LYMPHSABS 2.1 01/11/2009 1400   MONOABS 0.7 01/11/2009 1400   EOSABS 0.0 01/11/2009 1400   BASOSABS 0.0 01/11/2009 1400    No results found for: "POCLITH", "LITHIUM"   No results found for: "PHENYTOIN", "PHENOBARB", "VALPROATE", "CBMZ"   .res Assessment: Plan:    Recurrent major depression resistant to treatment (HCC) - Plan: PARoxetine (PAXIL) 10 MG tablet  Generalized anxiety disorder - Plan: PARoxetine (PAXIL) 10 MG tablet  Attention deficit hyperactivity disorder (ADHD), predominantly inattentive type  Insomnia due to mental condition - Plan: ALPRAZolam (XANAX) 0.5 MG tablet   30 min face to face time with patient was spent on counseling and coordination of care. We discussed overall depression better .  Problems with chronic dep and ADD Disc med  sensitivity, discussed her concerns in general fearfulness around medications.   Paroxetine helped again and felt worse off of it.   Rec counseling.   Disc options. Lorenda Cahill PhD.   Consider alternative Trintellix but it might not be affordable.  Consider augmentation with Rexulti DT benefit but SE Abilify. hold Abilify 2 mg daily helped depression but tremor.  Consider Rexulti if needed  More ADD sx without stimulant.  Few other options other than modafinil off label.  Unless she gets eye doctor to solve dry eyes.  She has disc this problem with them before.  Long history of dry eyes. No current stimulant DT dry eys.  We discussed the short-term risks associated with benzodiazepines including sedation and increased fall  risk among others.  Discussed long-term side effect risk including dependence, potential withdrawal symptoms, and the potential eventual dose-related risk of dementia.  Disc unusual combo stimulants but is serving good medical purpose.  Still takes Xanax 0.125-0.25 HS   Option she never tried:  (NAC) N-Acetylcysteine 2 of the  600 mg capsules daily to help with mild cognitive problems.  It can be combined with a B-complex vitamin as the B-12 and folate which can sometimes enhance the effect.  Disc option of TMS extensively.  She doesn't want to do this now.  Satisfied to continue paroxetine 10  FU 4 weeks  Meredith Staggers, MD, DFAPA   Please see After Visit Summary for patient specific instructions.  No future appointments.    No orders of the defined types were placed in this encounter.     -------------------------------

## 2023-01-25 ENCOUNTER — Other Ambulatory Visit: Payer: Self-pay | Admitting: Family Medicine

## 2023-01-25 DIAGNOSIS — R1011 Right upper quadrant pain: Secondary | ICD-10-CM | POA: Diagnosis not present

## 2023-01-25 DIAGNOSIS — R109 Unspecified abdominal pain: Secondary | ICD-10-CM

## 2023-01-31 ENCOUNTER — Other Ambulatory Visit: Payer: Medicare HMO

## 2023-02-02 ENCOUNTER — Other Ambulatory Visit: Payer: Medicare HMO

## 2023-02-09 DIAGNOSIS — R69 Illness, unspecified: Secondary | ICD-10-CM | POA: Diagnosis not present

## 2023-02-23 DIAGNOSIS — L578 Other skin changes due to chronic exposure to nonionizing radiation: Secondary | ICD-10-CM | POA: Diagnosis not present

## 2023-02-23 DIAGNOSIS — L718 Other rosacea: Secondary | ICD-10-CM | POA: Diagnosis not present

## 2023-02-23 DIAGNOSIS — L821 Other seborrheic keratosis: Secondary | ICD-10-CM | POA: Diagnosis not present

## 2023-03-07 DIAGNOSIS — M4726 Other spondylosis with radiculopathy, lumbar region: Secondary | ICD-10-CM | POA: Diagnosis not present

## 2023-03-08 ENCOUNTER — Ambulatory Visit: Payer: Medicare HMO | Admitting: Psychiatry

## 2023-03-14 DIAGNOSIS — M4726 Other spondylosis with radiculopathy, lumbar region: Secondary | ICD-10-CM | POA: Diagnosis not present

## 2023-03-15 DIAGNOSIS — H16223 Keratoconjunctivitis sicca, not specified as Sjogren's, bilateral: Secondary | ICD-10-CM | POA: Diagnosis not present

## 2023-03-16 DIAGNOSIS — R5383 Other fatigue: Secondary | ICD-10-CM | POA: Diagnosis not present

## 2023-03-16 DIAGNOSIS — R4189 Other symptoms and signs involving cognitive functions and awareness: Secondary | ICD-10-CM | POA: Diagnosis not present

## 2023-03-16 DIAGNOSIS — R252 Cramp and spasm: Secondary | ICD-10-CM | POA: Diagnosis not present

## 2023-03-16 DIAGNOSIS — L819 Disorder of pigmentation, unspecified: Secondary | ICD-10-CM | POA: Diagnosis not present

## 2023-03-17 DIAGNOSIS — M545 Low back pain, unspecified: Secondary | ICD-10-CM | POA: Diagnosis not present

## 2023-03-23 ENCOUNTER — Other Ambulatory Visit: Payer: Self-pay | Admitting: *Deleted

## 2023-03-23 DIAGNOSIS — R200 Anesthesia of skin: Secondary | ICD-10-CM

## 2023-03-24 ENCOUNTER — Telehealth: Payer: Self-pay | Admitting: Diagnostic Neuroimaging

## 2023-03-24 ENCOUNTER — Encounter (HOSPITAL_COMMUNITY): Payer: Medicare HMO

## 2023-03-24 ENCOUNTER — Ambulatory Visit (HOSPITAL_COMMUNITY)
Admission: RE | Admit: 2023-03-24 | Discharge: 2023-03-24 | Disposition: A | Discharge status: Home / Self Care | Payer: Medicare HMO | Source: Ambulatory Visit | Attending: Vascular Surgery | Admitting: Vascular Surgery

## 2023-03-24 DIAGNOSIS — R200 Anesthesia of skin: Secondary | ICD-10-CM | POA: Diagnosis not present

## 2023-03-24 DIAGNOSIS — R202 Paresthesia of skin: Secondary | ICD-10-CM | POA: Insufficient documentation

## 2023-03-24 LAB — VAS US ABI WITH/WO TBI
Left ABI: 1.16
Right ABI: 1.2

## 2023-03-24 NOTE — Telephone Encounter (Signed)
Pt called to get date of her last visit

## 2023-03-31 DIAGNOSIS — H1031 Unspecified acute conjunctivitis, right eye: Secondary | ICD-10-CM | POA: Diagnosis not present

## 2023-04-06 ENCOUNTER — Encounter: Payer: Medicare HMO | Admitting: Vascular Surgery

## 2023-04-06 NOTE — Progress Notes (Signed)
 Patient ID: Alyssa Vaughn, female   DOB: 30-Sep-1950, 73 y.o.   MRN: [id number]  Reason for Consult: New Patient (Initial Visit)   Referred by Dyane Anthony RAMAN, FNP  Subjective:     HPI  Alyssa Vaughn is a 73 y.o. female presenting for evaluation of circulation problems.  She reports she has had discoloration and some pins and needles sensation in bilateral feet.  She denies calf pain with ambulation, rest pain or ulceration.  She does have some aching in her lower legs. She denies significant swelling, aching or varicosities, does have some pronounced veins on her feet. She is relatively healthy and exercises multiple times per week. She is a never smoker.   Past Medical History:  Diagnosis Date   Arthritis    History reviewed. No pertinent family history. Past Surgical History:  Procedure Laterality Date   APPENDECTOMY     HERNIA REPAIR      Short Social History:  Social History   Tobacco Use   Smoking status: Never   Smokeless tobacco: Never  Substance Use Topics   Alcohol use: Never    Allergies  Allergen Reactions   Biaxin [Clarithromycin]     Upset stomach   Bupropion      Other Reaction(s): dizziness   Cyclobenzaprine     Other Reaction(s): swollen eyes   Desvenlafaxine     Other Reaction(s): Memory loss   Metaxalone Nausea Only   Quetiapine     Other Reaction(s): sleepy   Amoxicillin Rash   Ibuprofen Rash   Nsaids Rash    Current Outpatient Medications  Medication Sig Dispense Refill   ALPRAZolam  (XANAX ) 0.5 MG tablet TAKE 1/2 TO 1 (ONE-HALF TO ONE) TABLET BY MOUTH AT BEDTIME AS NEEDED FOR ANXIETY OR SLEEP 30 tablet 2   B Complex Vitamins (VITAMIN B COMPLEX) TABS 1 tablet Orally once a day     calcium carbonate (SUPER CALCIUM) 1500 (600 Ca) MG TABS tablet 1 tablet with meals Orally once a day     erythromycin ophthalmic ointment 1 application into the lower eyelid of affected eye Ophthalmic Four times a day for 7 days     MIEBO 1.338 GM/ML SOLN  1 drop into affected eye  as needed Ophthalmic Four times a day     Omega-3 1000 MG CAPS Take by mouth.     omeprazole (PRILOSEC) 20 MG capsule 1 capsule 30 minutes before morning meal Orally Once a day as needed for 90 days     vitamin C (ASCORBIC ACID) 500 MG tablet Take 500 mg by mouth daily.     PARoxetine  (PAXIL ) 10 MG tablet Take 1 tablet (10 mg total) by mouth daily. 90 tablet 1   No current facility-administered medications for this visit.    REVIEW OF SYSTEMS  All other systems were reviewed and are negative     Objective:  Objective   Vitals:   04/07/23 0825  BP: 121/83  Pulse: 70  Resp: 16  Temp: 97.8 F (36.6 C)  SpO2: 98%  Weight: 105 lb (47.6 kg)  Height: 5' 2 (1.575 m)   Body mass index is 19.2 kg/m.  Physical Exam General: no acute distress Cardiac: hemodynamically stable, nontachycardic Pulm: normal work of breathing GI: non-tender, no pulsatile mass Neuro: alert, no focal deficit Extremities: Venous congestion present on bilateral feet with some pronounced veins bilaterally, no edema, cyanosis or wounds Vascular:   Right: Palpable DP, PT, radial  Left: Palpable DP, PT, radial   Data: +---------+------------------+-----+-----------+--------+  Right   Rt Pressure (mmHg)IndexWaveform   Comment   +---------+------------------+-----+-----------+--------+  Brachial 140                                         +---------+------------------+-----+-----------+--------+  PTA     169               1.15 multiphasic          +---------+------------------+-----+-----------+--------+  DP      177               1.20 multiphasic          +---------+------------------+-----+-----------+--------+  Great Toe118               0.80 Normal               +---------+------------------+-----+-----------+--------+   +---------+------------------+-----+-----------+-------+  Left    Lt Pressure (mmHg)IndexWaveform   Comment   +---------+------------------+-----+-----------+-------+  Brachial 147                                        +---------+------------------+-----+-----------+-------+  PTA     170               1.16 multiphasic         +---------+------------------+-----+-----------+-------+  DP      167               1.14 multiphasic         +---------+------------------+-----+-----------+-------+  Great Toe107               0.73 Normal              +---------+------------------+-----+-----------+-------+   +-------+-----------+-----------+------------+------------+  ABI/TBIToday's ABIToday's TBIPrevious ABIPrevious TBI  +-------+-----------+-----------+------------+------------+  Right 1.20       0.80                                 +-------+-----------+-----------+------------+------------+  Left  1.16       0.73                                 +-------+-----------+-----------+------------+------------+      Assessment/Plan:     Alyssa Vaughn is a 73 y.o. female with neuropathy and venous congestion of her feet.  I explained that she does not have any arterial disease based on physical exam or her noninvasive studies today.  I explained that she likely has some element of chronic venous insufficiency causing some venous congestion and discoloration but explained that this is not medically dangerous and she is otherwise mildly symptomatic with some aching. Instructed to try compression stockings on days that she will be on her feet quite a bit and elevation at the end of the day. We also discussed a trial of gabapentin  which she was given back in May but has not taken yet. Follow-up as needed     Norman GORMAN Serve MD [facility]

## 2023-04-07 ENCOUNTER — Ambulatory Visit (INDEPENDENT_AMBULATORY_CARE_PROVIDER_SITE_OTHER): Payer: Medicare HMO | Admitting: Vascular Surgery

## 2023-04-07 ENCOUNTER — Encounter: Payer: Self-pay | Admitting: Vascular Surgery

## 2023-04-07 VITALS — BP 121/83 | HR 70 | Temp 97.8°F | Resp 16 | Ht 62.0 in | Wt 105.0 lb

## 2023-04-07 DIAGNOSIS — I872 Venous insufficiency (chronic) (peripheral): Secondary | ICD-10-CM | POA: Diagnosis not present

## 2023-04-12 DIAGNOSIS — M2011 Hallux valgus (acquired), right foot: Secondary | ICD-10-CM | POA: Diagnosis not present

## 2023-04-12 DIAGNOSIS — M2012 Hallux valgus (acquired), left foot: Secondary | ICD-10-CM | POA: Diagnosis not present

## 2023-04-12 DIAGNOSIS — M898X9 Other specified disorders of bone, unspecified site: Secondary | ICD-10-CM | POA: Diagnosis not present

## 2023-04-12 DIAGNOSIS — I879 Disorder of vein, unspecified: Secondary | ICD-10-CM | POA: Diagnosis not present

## 2023-04-12 DIAGNOSIS — M792 Neuralgia and neuritis, unspecified: Secondary | ICD-10-CM | POA: Diagnosis not present

## 2023-04-25 ENCOUNTER — Ambulatory Visit: Payer: Medicare HMO | Admitting: Psychiatry

## 2023-05-02 ENCOUNTER — Telehealth: Payer: Self-pay

## 2023-05-02 NOTE — Telephone Encounter (Signed)
See note

## 2023-05-02 NOTE — Telephone Encounter (Signed)
Pt called and LM on nurse triage line asking about what she could do about her legs.  On chart review pt had office visit with Dr. Hetty Blend due to her mild symptoms was instructed to try compression stockings on days that she will be on her feet quite a bit and elevation at the end of the day.  They also discussed a trial of gabapentin which she was given back in May but has not taken yet.   Returned call to pt and left message.

## 2023-05-03 ENCOUNTER — Encounter (HOSPITAL_COMMUNITY): Payer: Medicare HMO

## 2023-05-03 ENCOUNTER — Encounter: Payer: Medicare HMO | Admitting: Vascular Surgery

## 2023-05-03 DIAGNOSIS — I7300 Raynaud's syndrome without gangrene: Secondary | ICD-10-CM | POA: Diagnosis not present

## 2023-05-03 DIAGNOSIS — Z1211 Encounter for screening for malignant neoplasm of colon: Secondary | ICD-10-CM | POA: Diagnosis not present

## 2023-05-03 DIAGNOSIS — R202 Paresthesia of skin: Secondary | ICD-10-CM | POA: Diagnosis not present

## 2023-05-04 DIAGNOSIS — B880 Other acariasis: Secondary | ICD-10-CM | POA: Diagnosis not present

## 2023-05-04 DIAGNOSIS — H01009 Unspecified blepharitis unspecified eye, unspecified eyelid: Secondary | ICD-10-CM | POA: Diagnosis not present

## 2023-05-04 DIAGNOSIS — H16223 Keratoconjunctivitis sicca, not specified as Sjogren's, bilateral: Secondary | ICD-10-CM | POA: Diagnosis not present

## 2023-05-05 ENCOUNTER — Telehealth: Payer: Self-pay | Admitting: Diagnostic Neuroimaging

## 2023-05-05 NOTE — Telephone Encounter (Signed)
Pt called stating that her PCP is wanting her Neurologist to order a MRI for her due to worsening symptoms. Please advise.

## 2023-05-08 NOTE — Telephone Encounter (Signed)
Called the patient back and she states that when she saw Dr Marjory Lies back in May 2024 she was only having tingling sensation. More recently she has started having worsening symptoms of burning/tingling and numbness sensation. States that she has had discoloration in lower extremities. She recently had a visit with Vascular surgery and they advised she wears compression socks. She followed with her PCP and PCP wanted her to check back in with neurology to ensure nothing else is going on. Pt was in tears stating that pain has really intensified. In the vascular surgeon notes I saw where they mentioned gabapentin as a trial. So I asked if patient has tried this and she has not. She read side effects and was nervous to move forward. Advised since vascular surgeon just saw her and had mentioned that she can try and reach out to them in meantime to determine if they feel worth starting something to help with her discomfort. I have placed on Dr Richrd Humbles first opening 2/25 at 2 pm and added pt to wait list. She verbalized understanding.

## 2023-05-09 ENCOUNTER — Telehealth: Payer: Self-pay

## 2023-05-09 NOTE — Telephone Encounter (Addendum)
Triage Call - Medication: Pt called requesting to change her Gabapentin trial to Cymbalta stating the Gabapentin made her feel funny.  MD consulted and will defer to neurology or her PCP for medication management.  Returned call-No answer/No VM

## 2023-05-30 ENCOUNTER — Ambulatory Visit (INDEPENDENT_AMBULATORY_CARE_PROVIDER_SITE_OTHER): Payer: Medicare HMO | Admitting: Diagnostic Neuroimaging

## 2023-05-30 ENCOUNTER — Encounter: Payer: Self-pay | Admitting: Diagnostic Neuroimaging

## 2023-05-30 VITALS — BP 136/74 | HR 78 | Ht 62.0 in | Wt 105.0 lb

## 2023-05-30 DIAGNOSIS — R200 Anesthesia of skin: Secondary | ICD-10-CM

## 2023-05-30 DIAGNOSIS — R202 Paresthesia of skin: Secondary | ICD-10-CM

## 2023-05-30 NOTE — Progress Notes (Signed)
 [facility]  PATIENT: Alyssa Vaughn DOB: 1950-06-03  REFERRING CLINICIAN: Mila Palmer, MD HISTORY FROM: patient REASON FOR VISIT: follow up   HISTORICAL  CHIEF COMPLAINT:  Chief Complaint  Patient presents with   Numbness    Rm 7 alone Pt is well, reports she has been having tingling and burning in all extremities as well as in her face. She is also having pain in LLE. She also mentions discoloration in both feet.     HISTORY OF PRESENT ILLNESS:   UPDATE (05/30/23, VRP): Since last visit, more tingling sensation in face, arms, legs, constant. Non painful sensation. Aching in calves. Left foot pain (toes; has seen podiatry).   PRIOR HPI (08/10/22, VRP): 73 year old female here for evaluation of numbness and tingling.  Symptoms started around 2022.  She describes numbness and tingling sensation in her arms, legs, hands and feet.  Also has history of cervical spine surgery and chronic low back pain.  Was prescribed low-dose gabapentin but had not tried it yet.   REVIEW OF SYSTEMS: Full 14 system review of systems performed and negative with exception of: as per HPI.  ALLERGIES: Allergies  Allergen Reactions   Biaxin [Clarithromycin]     Upset stomach   Bupropion     Other Reaction(s): dizziness   Cyclobenzaprine     Other Reaction(s): swollen eyes   Desvenlafaxine     Other Reaction(s): Memory loss   Metaxalone Nausea Only   Quetiapine     Other Reaction(s): sleepy   Amoxicillin Rash   Ibuprofen Rash   Nsaids Rash    HOME MEDICATIONS: Outpatient Medications Prior to Visit  Medication Sig Dispense Refill   ALPRAZolam (XANAX) 0.5 MG tablet TAKE 1/2 TO 1 (ONE-HALF TO ONE) TABLET BY MOUTH AT BEDTIME AS NEEDED FOR ANXIETY OR SLEEP 30 tablet 2   calcium carbonate (SUPER CALCIUM) 1500 (600 Ca) MG TABS tablet 1 tablet with meals Orally once a day     DULoxetine (CYMBALTA) 20 MG capsule Take 20 mg by mouth daily.     erythromycin ophthalmic  ointment 1 application into the lower eyelid of affected eye Ophthalmic Four times a day for 7 days     MIEBO 1.338 GM/ML SOLN 1 drop into affected eye  as needed Ophthalmic Four times a day     Omega-3 1000 MG CAPS Take by mouth.     omeprazole (PRILOSEC) 20 MG capsule 1 capsule 30 minutes before morning meal Orally Once a day as needed for 90 days     vitamin C (ASCORBIC ACID) 500 MG tablet Take 500 mg by mouth daily.     B Complex Vitamins (VITAMIN B COMPLEX) TABS 1 tablet Orally once a day     PARoxetine (PAXIL) 10 MG tablet Take 1 tablet (10 mg total) by mouth daily. 90 tablet 1   No facility-administered medications prior to visit.    PAST MEDICAL HISTORY: Past Medical History:  Diagnosis Date   Arthritis     PAST SURGICAL HISTORY: Past Surgical History:  Procedure Laterality Date   APPENDECTOMY     HERNIA REPAIR      FAMILY HISTORY: History reviewed. No pertinent family history.  SOCIAL HISTORY: Social History   Socioeconomic History   Marital status: Divorced    Spouse name: Not on file   Number of children: Not on file   Years of education: Not on file   Highest education level: Not on file  Occupational History   Not on file  Tobacco Use   Smoking status: Never   Smokeless tobacco: Never  Vaping Use   Vaping status: Never Used  Substance and Sexual Activity   Alcohol use: Never   Drug use: Never   Sexual activity: Not on file  Other Topics Concern   Not on file  Social History Narrative   Not on file   Social Drivers of Health   Financial Resource Strain: Not on file  Food Insecurity: Not on file  Transportation Needs: Not on file  Physical Activity: Not on file  Stress: Not on file  Social Connections: Not on file  Intimate Partner Violence: Not on file     PHYSICAL EXAM  GENERAL EXAM/CONSTITUTIONAL: Vitals:  Vitals:   05/30/23 1351  BP: 136/74  Pulse: 78  Weight: 105 lb (47.6 kg)  Height: 5\' 2"  (1.575 m)   Body mass index is 19.2  kg/m. Wt Readings from Last 3 Encounters:  05/30/23 105 lb (47.6 kg)  04/07/23 105 lb (47.6 kg)  08/10/22 104 lb (47.2 kg)   Patient is in no distress; well developed, nourished and groomed; neck is supple  CARDIOVASCULAR: Examination of carotid arteries is normal; no carotid bruits Regular rate and rhythm, no murmurs Examination of peripheral vascular system by observation and palpation is normal  EYES: Ophthalmoscopic exam of optic discs and posterior segments is normal; no papilledema or hemorrhages No results found.  MUSCULOSKELETAL: Gait, strength, tone, movements noted in Neurologic exam below  NEUROLOGIC: MENTAL STATUS:      No data to display         awake, alert, oriented to person, place and time recent and remote memory intact normal attention and concentration language fluent, comprehension intact, naming intact fund of knowledge appropriate  CRANIAL NERVE:  2nd - no papilledema on fundoscopic exam 2nd, 3rd, 4th, 6th - pupils equal and reactive to light, visual fields full to confrontation, extraocular muscles intact, no nystagmus 5th - facial sensation symmetric 7th - facial strength symmetric 8th - hearing intact 9th - palate elevates symmetrically, uvula midline 11th - shoulder shrug symmetric 12th - tongue protrusion midline  MOTOR:  normal bulk and tone, full strength in the BUE, BLE  SENSORY:  normal and symmetric to light touch, temperature, vibration  COORDINATION:  finger-nose-finger, fine finger movements normal  REFLEXES:  deep tendon reflexes 1+ and symmetric  GAIT/STATION:  narrow based gait     DIAGNOSTIC DATA (LABS, IMAGING, TESTING) - I reviewed patient records, labs, notes, testing and imaging myself where available.  Lab Results  Component Value Date   WBC 7.1 09/05/2017   HGB 14.5 09/05/2017   HCT 42.6 09/05/2017   MCV 90.4 09/05/2017   PLT 206 09/05/2017      Component Value Date/Time   NA 143 09/05/2017 1535    K 3.7 09/05/2017 1535   CL 106 09/05/2017 1535   CO2 29 09/05/2017 1535   GLUCOSE 99 09/05/2017 1535   BUN 14 09/05/2017 1535   CREATININE 0.69 09/05/2017 1535   CALCIUM 9.9 09/05/2017 1535   GFRNONAA >60 09/05/2017 1535   GFRAA >60 09/05/2017 1535   No results found for: "CHOL", "HDL", "LDLCALC", "LDLDIRECT", "TRIG", "CHOLHDL" No results found for: "HGBA1C" No results found for: "VITAMINB12" Lab Results  Component Value Date   TSH 5.276 (H) 09/05/2017      ASSESSMENT AND PLAN  73 y.o. year old female here with:   Dx:  1. Numbness and tingling      PLAN:  NUMBNESS TINGLING (face,  arms, legs; mild-moderate; since 2022; some anxiety sxs) - neuro exam normal; however symptoms have progressed; check labs and MRI brain / cervical spine (rule out demyelinating dz or other causes) - continue B12 replacement per PCP  Orders Placed This Encounter  Procedures   MR BRAIN W WO CONTRAST   MR CERVICAL SPINE W WO CONTRAST   Vitamin B12   TSH Rfx on Abnormal to Free T4   ANA,IFA RA Diag Pnl w/rflx Tit/Patn   Multiple Myeloma Panel (SPEP&IFE w/QIG)   Vitamin B1   Vitamin B6   Return for pending if symptoms worsen or fail to improve, pending test results.    Suanne Marker, MD 05/30/2023, 2:39 PM Certified in Neurology, Neurophysiology and Neuroimaging  [facility] [address] [place], [place] [address] [phone]

## 2023-06-01 ENCOUNTER — Telehealth: Payer: Self-pay | Admitting: Diagnostic Neuroimaging

## 2023-06-01 DIAGNOSIS — H04123 Dry eye syndrome of bilateral lacrimal glands: Secondary | ICD-10-CM | POA: Diagnosis not present

## 2023-06-01 NOTE — Telephone Encounter (Signed)
 sent to GI they obtain Drucie Opitz [phone]

## 2023-06-06 ENCOUNTER — Telehealth: Payer: Self-pay | Admitting: Diagnostic Neuroimaging

## 2023-06-06 LAB — MULTIPLE MYELOMA PANEL, SERUM
Albumin SerPl Elph-Mcnc: 4 g/dL (ref 2.9–4.4)
Albumin/Glob SerPl: 1.9 — ABNORMAL HIGH (ref 0.7–1.7)
Alpha 1: 0.2 g/dL (ref 0.0–0.4)
Alpha2 Glob SerPl Elph-Mcnc: 0.6 g/dL (ref 0.4–1.0)
B-Globulin SerPl Elph-Mcnc: 0.7 g/dL (ref 0.7–1.3)
Gamma Glob SerPl Elph-Mcnc: 0.7 g/dL (ref 0.4–1.8)
Globulin, Total: 2.2 g/dL (ref 2.2–3.9)
IgA/Immunoglobulin A, Serum: 73 mg/dL (ref 64–422)
IgG (Immunoglobin G), Serum: 670 mg/dL (ref 586–1602)
IgM (Immunoglobulin M), Srm: 124 mg/dL (ref 26–217)
Total Protein: 6.2 g/dL (ref 6.0–8.5)

## 2023-06-06 LAB — ANA,IFA RA DIAG PNL W/RFLX TIT/PATN
Cyclic Citrullin Peptide Ab: 4 U (ref 0–19)
Rheumatoid fact SerPl-aCnc: <10 [IU]/mL (ref <14.0)

## 2023-06-06 LAB — VITAMIN B12: Vitamin B-12: 630 pg/mL (ref 232–1245)

## 2023-06-06 LAB — VITAMIN B6: Vitamin B6: 53.5 ug/L (ref 3.4–65.2)

## 2023-06-06 LAB — VITAMIN B1: Thiamine: 145.8 nmol/L (ref 66.5–200.0)

## 2023-06-06 LAB — TSH RFX ON ABNORMAL TO FREE T4: TSH: 1.24 u[IU]/mL (ref 0.450–4.500)

## 2023-06-06 NOTE — Telephone Encounter (Signed)
 I see where most lab results are posted but MD has not yet reviewed. I will send a message to MD to review and result for the pt. She is schedule 3/20 for imaging to be completed.

## 2023-06-06 NOTE — Telephone Encounter (Signed)
 Pt is asking for a call to discuss recent test results

## 2023-06-12 ENCOUNTER — Encounter: Payer: Self-pay | Admitting: Diagnostic Neuroimaging

## 2023-06-15 NOTE — Progress Notes (Signed)
 Normal labs. -VRP

## 2023-06-15 NOTE — Telephone Encounter (Signed)
 Called the patient and reviewed the normal lab results with the patient.

## 2023-06-21 ENCOUNTER — Encounter: Payer: Self-pay | Admitting: Psychiatry

## 2023-06-21 ENCOUNTER — Ambulatory Visit (INDEPENDENT_AMBULATORY_CARE_PROVIDER_SITE_OTHER): Payer: Medicare HMO | Admitting: Psychiatry

## 2023-06-21 DIAGNOSIS — F900 Attention-deficit hyperactivity disorder, predominantly inattentive type: Secondary | ICD-10-CM | POA: Diagnosis not present

## 2023-06-21 DIAGNOSIS — F5105 Insomnia due to other mental disorder: Secondary | ICD-10-CM

## 2023-06-21 DIAGNOSIS — F339 Major depressive disorder, recurrent, unspecified: Secondary | ICD-10-CM

## 2023-06-21 DIAGNOSIS — F411 Generalized anxiety disorder: Secondary | ICD-10-CM | POA: Diagnosis not present

## 2023-06-21 MED ORDER — ALPRAZOLAM 0.5 MG PO TABS: ORAL_TABLET | ORAL | 5 refills | Status: DC

## 2023-06-21 NOTE — Progress Notes (Signed)
 Alyssa Vaughn [id number] 07/31/1950 73 y.o.    Subjective:   Patient ID:  Alyssa Vaughn is a 73 y.o. (DOB May 05, 1950) female.  Chief Complaint:  Chief Complaint  Patient presents with   Follow-up   Depression   Anxiety   Medication Reaction    Alyssa Vaughn presents to the office today for follow-up of increased Alyssa Vaughn for worsening depression without reason.    seen October 2020.  No meds were changed.  Alyssa Vaughn 20 mg was continued.  07/09/2019 appointment the following is noted: Wants to try Alyssa Vaughn bc easily confused, distracted and unfocused as noted. Good overall.  Alyssa Vaughn helps anxiety and tears. Only taking Alyssa Vaughn 1/2 tablet at night.   Still ADD and head does not feel awake.  Hard to concentrate with groups.  But also doesn't feel quite awake and alert usually.   Feel like my brain won't wake up.  Tried Alyssa Vaughn 50 mg but nervous so much couldn't evaluate effect on ADD.  Has tried lots of ADD meds and didn't tolerate them. Grand kids in best.  [name] murdered but he hasn't been in the picture.  Pleased she's been active.  Not generally self-motivated and pleased she's stayed busy. 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  I just have to let it go.   Can't focus on Alyssa Vaughn.  She periodically struggles with sadness over the brokenness in the relationship.  Recognizes she craves his attention.  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  Don't need Alyssa Vaughn at any other time.  Patient denies difficulty with sleep initiation or maintenance. 7-8 hours.  Denies appetite disturbance.  Patient reports that energy and motivation have been good.  Patient denies any suicidal ideation.  No longer depressed usually and anxious but episodic.  Alyssa Vaughn good once daily for anxiety.  Alyssa Vaughn for sleep ok. Plan: Start Alyssa Vaughn 1 ml daily and may increase by 1 ml weekly as tolerated up to  about 3-4 ml  09/03/2019 phone call that [name] was too expensive and wanted a prescription for Alyssa Vaughn 5 mg which was sent.  09/09/2019 appointment with the following noted: Alyssa Vaughn helpful.  takiing up to BID and it makes a big difference in focus and productivity. Tolerating it well.   Taking a little alprazolam at night usually about 1/4 of 0.5 mg night.y.  Not taking Alyssa Vaughn. Asks about taper BZ. Overall feels the best she's been.  After years of divorced she feels more free. Less fear spiritually now. No med changes  03/10/20 appt with following noted: Not taking stimulants. Taking 0.125 mg alprazolam HS.  Makes her sleepy a little.  Not sure if she needs it.  Wonders about stopping .   Overall good and bad.  Up and down mood with a lot of back problems which she's working on.  Sometimes unmanageable. Low days fight with herself, tired and uninterested.  Stress and obsess over Alyssa Vaughn. Divorced 2008 and still not over it.  Still no life for herself. Don't get involved with things.  Only looks forward to popcorn and going to bed early.  Everything is drudgery and stay unmotivated. These sx come and go.  It's always there in the background but waxes and wanes.   Working on it. Plan no changes and continue Alyssa Vaughn and Alyssa Vaughn.  06/02/2020 appointment with following noted: Not taking Alyssa Vaughn.  Continued Alyssa Vaughn.   Tried to address her issues with Alyssa Vaughn.  Other stuff still there with cry9ing and hard to focus and get through the day.  Weary of trying meds.  Trying to work on herself with self help.  Tried diet and exercise without change in sx.  Fear of going out witiut a reason.  Feels stuck. Last filled Alyssa Vaughn Augst 2021. Still depressed.  Struggles to get things done. Recent normal physical.  Plan:  Potentiate Alyssa Vaughn 2 mg tablet 1/2 daily for 2 weeks, then 1 tablet for sig depression  07/21/2020 appt noted: Good with Alyssa Vaughn it helped a lot noticeably, smooth Taking  Alyssa Vaughn 2 mg daily. Got rid of heavy feeling of depresssion.  No crying.  Easier to do things and go places No SE with meds. Restarted Alyssa Vaughn 5 mg TID too low.  Wants ER. Needs equivalent of 10 TID.  Helps the confusion of the day and improves focus and completion.  Good combo with Alyssa Vaughn. Plan:  Switch to LA Alyssa Vaughn 36 mg AM.  Call if too much.  09/01/2020 appointment noted: She called a couple times since being here and found both the 36 mg size and the 27 mg size of Alyssa Vaughn to be "too much" including symptoms of tension and anxiety and wanted to switch back to [name] 5 mg 3 times daily.  She was offered the option of going to the lowest dose of Alyssa Vaughn 18 mg but declined. Alyssa Vaughn 5 TID not very effective.  Would like to try lower dose of Alyssa Vaughn. Mood still very good and unusual for me.  Benefit Alyssa Vaughn.  Working on herself.   Plan for depression recommend potentiation with Alyssa Vaughn up to 2 mg daily.  Sleep is good.    03/03/2021 appointment with the following noted: Not taking Alyssa Vaughn SE and up/down feelings from it.  It is helpful but not necessary. Saw benefit with Alyssa Vaughn in the beginning but didn't feel like herself.  Helped the depression and easier to go out and do things. Took [name] for 3-4 mos.  , Overall doing OK.  Better situation in the family with good grandparenting with Alyssa Vaughn and does things with Alyssa Vaughn socially and it helped.  Better sense of focus and direction for her life and less demanding of him.  Good relationship with Alyssa Vaughn and the family.  Settling more into retirement.  Anxiety manageable. Exercise more consistent.  07/28/21 appt noted: Taking Alyssa Vaughn 20 and Alyssa Vaughn 5 mg TID. Without Alyssa Vaughn sliding back into cycle of sadness, crying, poor focus.  Started Alyssa Vaughn back and more motivation and concentration.. Moved appt up. Still struggles with sadness and low interest and energy and low self esteem.  No SI.  Persistent depression and she  fights it with diet and exercise and activity.  Feels heavy Asked about retring Alyssa Vaughn Plan: Reduce Alyssa Vaughn to 10 mg daily.  Call if any SE.  Add Alyssa Vaughn XL 150 mg daily for depression and stop stimulant.  08/24/21 appt noted: It failed.  Couldn't tolerate it for a week bc dry eyes re: Alyssa Vaughn. So decided to retry Alyssa Vaughn for mood. 2 mg daily and increased Alyssa Vaughn 20 mg daily. More steady and less down and more motivated with Alyssa Vaughn 2 daily.   SE a little foggy and hope for no weight gain. Alyssa Vaughn a little drying but asks about retrying dextrostat.  Not taking stimulant currently.  +12/01/21 appt noted: Loistine Chance has Covid and she was exposed. Has continued Alyssa Vaughn 20 mg daily without Alyssa Vaughn.  Stopped it Vaughn tremor R hand. Added Alyssa Vaughn 5 mg BID  is usual.  Seems adequate duration.  Leveled things out with distraction, attn is much better.  Able to calm down and be present.  Tolerating it fine so far.  Using the generic from [facility] and tolerates it better than others she takes.  Yellow and the white one have been tolerated.   Mood is good  though initially worse off Alyssa Vaughn.  No crying now.  Mood smoother on Alyssa Vaughn. Plan: Still takes Alyssa Vaughn 0.125-0.25 HS Continue Alyssa Vaughn 20 mg daily.  Call if any SE.  hold Alyssa Vaughn 2 mg daily helped depression but tremor.  Consider Rexulti if needed Continue Alyssa Vaughn 5 mg BID-TID  05/11/22 appt noted: Doing ok since then. Nov eyes got dry with Alyssa Vaughn and stopped and retried.  Off for a month. Calmer and more centered in present and motivated with stimulant than without it.  Also less blah. Mood a little down but overall steady. Frustrated with some medical problems and back pain.   No concerns with current meds.   Sleep is good overall except for rib pain lately. Plan: Still takes Alyssa Vaughn 0.125-0.25 HS Continue Alyssa Vaughn 20 mg daily.  Call if any SE.  hold Alyssa Vaughn 2 mg daily helped depression but tremor.   Trial Alyssa Vaughn 50 mg  AM  08/02/22  appt noted: No Alyssa Vaughn Vaughn HA. Not taking dextrostat Vaughn heart racing even at 2.5 mg. More crying lately for 2 mos.  Hard to get through the day.  Heavy feeling and can't see reason for the crying.   Stimulant helped motivation but mood is worse off it. Decided prozac not working and reduced it to 10 mg daily. She's been researching and thinks what she is missing is DA.  Taking Velvet bean OTC which is supposed to help but no effect noted.   Plan: Disc off label for depression and SE: Pramipexole 1/2 tablet in the AM for 3 days,  Then 1 tablet each AM for 3 days,  Then 1 in the AM and 1/2 tablet in PM for 3 days, Then 1 twice daily  5/29/234 appt noted: Didn't tolerate the pramipexole at 0.25 mg tablet bc dizziness. Then called and asked to start Alyssa Vaughn which caused HA and dry eyes. On no psych med at this time.  Stopped Alyssa Vaughn for a couple of weeks. Asks about Paxil retrial but didn't tolerate it before. Dep, sad, cry, wants to isolate, reduced interest, anxious.  No SI Plan; Plan paroxetine susp 10mg /22ml 1 ml daily for 1 week then 2 ml daily Call 3rd week of June if not better and we'll increase the dose  09/23/22 TC:  Patient reporting that the Paxil 5 mg has been effective for the crying spells and overall has gotten her out of the "dark spot." She would like to increase dose to 10 mg. Reporting no SE with the 5 mg dose, which was started on 5/31. Has FU 7/10.      10/12/2022 appointment noted: She reduced the dose paroxetine to 5 mg daily from 10 mg Vaughn dry eyes.  But she thinks it might make her irritable.  Only took 10 mg a couple of days.   Eye doc dx ocular rosacea.  Given some drops but hasn't started all the drops yet.  One more new drop coming Monday which might help eyes. Now worries med might be causing rosacea. Now ambivalent about the paroxetine. She thought paroxetine helped dep and crying initially but it has returned again.  No reason for the  depression.   Plan:  She wants to stop paroxetine Vaughn dry eyes.  She worries it might worsen rosacea.  Stop it and don't start anything new until this is addressed.  Can restart paroxetine and go back to 10 mg daily. Other option is Trintellix which is unlikely to cause dryness.  Gave samples 5 mg and disc se  12/07/22 appt noted: She reduced paroxetine to 5 mg daily.  Off it for a week or 2 then resumed paroxetine and 8/10 started paroxetine 10 mg daily.  Crying is better. Usually first thing that improves on med. Eyes are a separate issue.   Pushed herself to stay on paroxetine. Anxiety is ok and low lately.  Easier to accept things, less easily upset. Still some dep sx.  Still trouble with lacking Zest for living.  Low grade dep not looking forward to much but then likes it when doing it.   Still keeps GD 8 hours 3 days per week. Attends church.  Not oversleeping as much as she was.   Alyssa Vaughn 0.25 mg HS helps sleep.  06/21/23 appt noted: Med: duloxetine 20, alprazolam 0.25 mg HS.  Stopped paroxetine in October.   Still crying some at 5-10 mg daily. Since then a whole lot of mess.   For years had whole body tingling UE and LE but not severe.   In Dec it got a lot worse and affected calves and feet turned purple.  They hurt and burn now.  No numbness.  Saw neuro PCP started duloxetine 20 mg daily 6 weeks ago.  Also dealing with dry eye syndrome and taking eye drop.   Thinks duloxetine helped some with UE sx.   Also helped mood and anxiety. MRI tomorrow.   Past Psychiatric Medication Trials: Alyssa Vaughn SE, methylphenidate tablets,  , Dextrostat, Adderall, Alyssa Vaughn, Vyvanse, Evekeo, Alyssa Vaughn 27 SE, Alyssa Vaughn 5 mg BID tolerated mfg [employer], dry eyes Alyssa Vaughn 50 HA Strattera,  Deplin,   Bupropion 37.5 mg SE HA and dry eyes Alyssa Vaughn 20, 10 NR, Lexapro, sertraline 37.5 side effects,  Paroxetine10 , citalopram,  venlafaxine, duloxetine, Pristiq,  Very brief Trintellix 5  Pramipexole  0.25 mg dizzy buspirone,  Alyssa Vaughn 2 marked benefit but CO some SE spacy and stopped.  Alyssa Vaughn sed, Alyssa Vaughn,    Hydroxyzine Last counseling years ago.  Review of Systems:  Review of Systems  Cardiovascular:  Negative for chest pain and palpitations.  Musculoskeletal:  Positive for back pain.  Neurological:  Negative for tremors and weakness.  Psychiatric/Behavioral:  Positive for decreased concentration and dysphoric mood. Negative for agitation, behavioral problems, confusion, hallucinations, self-injury, sleep disturbance and suicidal ideas. The patient is not nervous/anxious and is not hyperactive.     Medications: I have reviewed the patient's current medications.  Current Outpatient Medications  Medication Sig Dispense Refill   calcium carbonate (SUPER CALCIUM) 1500 (600 Ca) MG TABS tablet 1 tablet with meals Orally once a day     DULoxetine (CYMBALTA) 20 MG capsule Take 20 mg by mouth daily.     erythromycin ophthalmic ointment 1 application into the lower eyelid of affected eye Ophthalmic Four times a day for 7 days     MIEBO 1.338 GM/ML SOLN 1 drop into affected eye  as needed Ophthalmic Four times a day     Omega-3 1000 MG CAPS Take by mouth.     omeprazole (PRILOSEC) 20 MG capsule 1 capsule 30 minutes before morning meal Orally Once a day as needed for 90 days     vitamin C (ASCORBIC ACID)  500 MG tablet Take 500 mg by mouth daily.     ALPRAZolam (Alyssa Vaughn) 0.5 MG tablet TAKE 1/2 TO 1 (ONE-HALF TO ONE) TABLET BY MOUTH AT BEDTIME AS NEEDED FOR ANXIETY OR SLEEP 30 tablet 5   No current facility-administered medications for this visit.    Medication Side Effects: None  Allergies:  Allergies  Allergen Reactions   Biaxin [Clarithromycin]     Upset stomach   Bupropion     Other Reaction(s): dizziness   Cyclobenzaprine     Other Reaction(s): swollen eyes   Desvenlafaxine     Other Reaction(s): Memory loss   Metaxalone Nausea Only   Quetiapine     Other Reaction(s): sleepy    Amoxicillin Rash   Ibuprofen Rash   Nsaids Rash    Past Medical History:  Diagnosis Date   Arthritis     History reviewed. No pertinent family history.  History, Surgical history, Social history, and Family history were reviewed and updated as appropriate.   Please see review of systems for further details on the patient's review from today.   Objective:   Physical Exam:  There were no vitals taken for this visit.  Physical Exam Constitutional:      General: She is not in acute distress.    Appearance: She is well-developed.  Musculoskeletal:        General: No deformity.  Neurological:     Mental Status: She is alert and oriented to person, place, and time.     Motor: No tremor.     Coordination: Coordination normal.     Gait: Gait normal.  Psychiatric:        Attention and Perception: Perception normal. She is attentive.        Mood and Affect: Mood is depressed. Mood is not anxious. Affect is blunt. Affect is not labile, angry, tearful or inappropriate.        Speech: Speech normal.        Behavior: Behavior normal.        Thought Content: Thought content normal. Thought content is not delusional. Thought content does not include homicidal or suicidal ideation. Thought content does not include suicidal plan.        Cognition and Memory: Cognition normal.        Judgment: Judgment normal.     Comments: Insight and judgment good. No auditory or visual hallucinations. No delusions.  Less severe dep and anxiety        Lab Review:     Component Value Date/Time   NA 143 09/05/2017 1535   K 3.7 09/05/2017 1535   CL 106 09/05/2017 1535   CO2 29 09/05/2017 1535   GLUCOSE 99 09/05/2017 1535   BUN 14 09/05/2017 1535   CREATININE 0.69 09/05/2017 1535   CALCIUM 9.9 09/05/2017 1535   PROT 6.2 05/30/2023 1452   GFRNONAA >60 09/05/2017 1535   GFRAA >60 09/05/2017 1535       Component Value Date/Time   WBC 7.1 09/05/2017 1535   RBC 4.71 09/05/2017 1535   HGB  14.5 09/05/2017 1535   HCT 42.6 09/05/2017 1535   PLT 206 09/05/2017 1535   MCV 90.4 09/05/2017 1535   MCH 30.8 09/05/2017 1535   MCHC 34.0 09/05/2017 1535   RDW 11.6 09/05/2017 1535   LYMPHSABS 2.1 01/11/2009 1400   MONOABS 0.7 01/11/2009 1400   EOSABS 0.0 01/11/2009 1400   BASOSABS 0.0 01/11/2009 1400    No results found for: "POCLITH", "LITHIUM"   No results  found for: "PHENYTOIN", "PHENOBARB", "VALPROATE", "CBMZ"   .res Assessment: Plan:    Recurrent major depression resistant to treatment (HCC)  Generalized anxiety disorder  Attention deficit hyperactivity disorder (ADHD), predominantly inattentive type  Insomnia due to mental condition - Plan: ALPRAZolam (Alyssa Vaughn) 0.5 MG tablet   30 min face to face time with patient was spent on counseling and coordination of care. We discussed overall depression better .  Problems with chronic dep and ADD Disc med sensitivity, discussed her concerns in general fearfulness around medications.   Benefit for mood and anxiety and neuropathy with duloxetine 20 mg daily. Disc importance of dosing and range but she's med sensitive and getting benefit at low dose.  Consider alternative Trintellix but it might not be affordable.    More ADD sx without stimulant.  Few other options other than Alyssa Vaughn off label.  Unless she gets eye doctor to solve dry eyes.  She has disc this problem with them before.  Long history of dry eyes. No current stimulant Vaughn dry eys.  We discussed the short-term risks associated with benzodiazepines including sedation and increased fall risk among others.  Discussed long-term side effect risk including dependence, potential withdrawal symptoms, and the potential eventual dose-related risk of dementia.  But recent studies from 2020 dispute this association between benzodiazepines and dementia risk. Newer studies in 2020 do not support an association with dementia.  Still takes Alyssa Vaughn 0.125-0.25 HS   Option she never  tried:  (NAC) N-Acetylcysteine 2 of the  600 mg capsules daily to help with mild cognitive problems.  It can be combined with a B-complex vitamin as the B-12 and folate which can sometimes enhance the effect.  Disc option of TMS extensively.  She doesn't want to do this now.  Satisfied to continue duloxetine 20 with some benefit.  FU 6 mos  Meredith Staggers, MD, DFAPA   Please see After Visit Summary for patient specific instructions.  Future Appointments  Date Time Provider Department Center  06/22/2023 10:00 AM [facility]  06/22/2023 10:40 AM [facility]      No orders of the defined types were placed in this encounter.     -------------------------------

## 2023-06-22 ENCOUNTER — Ambulatory Visit
Admission: RE | Admit: 2023-06-22 | Discharge: 2023-06-22 | Disposition: A | Discharge status: Home / Self Care | Payer: Medicare HMO | Source: Ambulatory Visit | Attending: Diagnostic Neuroimaging | Admitting: Diagnostic Neuroimaging

## 2023-06-22 DIAGNOSIS — R202 Paresthesia of skin: Secondary | ICD-10-CM

## 2023-06-22 DIAGNOSIS — R200 Anesthesia of skin: Secondary | ICD-10-CM | POA: Diagnosis not present

## 2023-06-22 MED ORDER — GADOPICLENOL 0.5 MMOL/ML IV SOLN
4.5000 mL | Freq: Once | INTRAVENOUS | Status: AC | PRN
  Administered 2023-06-22: 4.5 mL via INTRAVENOUS

## 2023-06-29 ENCOUNTER — Telehealth: Payer: Self-pay | Admitting: Diagnostic Neuroimaging

## 2023-06-29 NOTE — Telephone Encounter (Signed)
 Pt has called for the results to her MRI

## 2023-07-04 DIAGNOSIS — R21 Rash and other nonspecific skin eruption: Secondary | ICD-10-CM | POA: Diagnosis not present

## 2023-07-04 DIAGNOSIS — R030 Elevated blood-pressure reading, without diagnosis of hypertension: Secondary | ICD-10-CM | POA: Diagnosis not present

## 2023-07-06 ENCOUNTER — Encounter: Payer: Self-pay | Admitting: Diagnostic Neuroimaging

## 2023-07-07 NOTE — Telephone Encounter (Signed)
 I contacted pt back, she question the MRI but did not review the [facility] msg MD sent to her. I informed her her noted small calcified lesion in the brainstem, but not clear if this is related to symptoms. Other parts of brain are normal.  MD recommend to repeat MRI brain w/wo in 6 months. She questioned if EMG was needed at this point. I advised I will discuss with MD and give her a call back next week. She verbally understood and was appreciative.

## 2023-07-07 NOTE — Telephone Encounter (Signed)
 Small calcified lesion in the brainstem, but not clear if this is related to symptoms. Other parts of brain and cervical spine are normal.  Recommend to repeat MRI brain w/wo in 6 months. -VRP

## 2023-07-07 NOTE — Telephone Encounter (Signed)
 Pt is asking for a call to discuss the results of the MRI in greater detail.

## 2023-07-10 NOTE — Telephone Encounter (Signed)
 MRI brain is likely an incidental finding (not related to symptoms)  Neuropathy is a possibility, but no underlying cause has been found based on lab work testing. An EMG/NCS won't help [place] find the cause, so I don't recommend at this time. Neuropathy mgmt in this case would be pain control with gabapentin, cymbalta, lyrica or pain mgmt referral.   Patient should discuss with PCP about raynaud's evaluation (usually with rheumatology consult).   Suanne Marker, MD 07/10/2023, 10:05 AM Certified in Neurology, Neurophysiology and Neuroimaging  [facility] [address], [address] [place], [place] [address] [phone]

## 2023-07-11 NOTE — Telephone Encounter (Signed)
See my chart message to pt

## 2023-07-25 DIAGNOSIS — I7300 Raynaud's syndrome without gangrene: Secondary | ICD-10-CM | POA: Diagnosis not present

## 2023-07-25 DIAGNOSIS — I10 Essential (primary) hypertension: Secondary | ICD-10-CM | POA: Diagnosis not present

## 2023-08-01 DIAGNOSIS — H16223 Keratoconjunctivitis sicca, not specified as Sjogren's, bilateral: Secondary | ICD-10-CM | POA: Diagnosis not present

## 2023-08-07 ENCOUNTER — Telehealth: Payer: Self-pay | Admitting: Diagnostic Neuroimaging

## 2023-08-07 NOTE — Telephone Encounter (Signed)
 Patient would like a call to discuss the MRI results, and referral for Rheumatology.

## 2023-08-07 NOTE — Telephone Encounter (Signed)
 Spoke w/Pt regarding MRI results and rheumatology consult. Reiterated results previously discussed w/Pt on 4/7 and that Dr. Salli Crawley had recommended she discuss with PCP about raynaud's evaluation which is usually w/rheumatology consult. Pt states she remembers the 4/7 conversation and she did discuss with her PCP about the rheumatology consult. Pt stated her PCP responded that she didn't think that was necessary and that rheumatologist have more important issues to address. Pt wasn't quite sure what to do next. Encouraged Pt to reach out to PCP regarding Dr Elon Hakim statement regarding the raynaud's evaluation which is usually with rheumatology consult but PCP may be considering raynaud's evaluation and managing herself. Pt stated understanding and will reach out again to PCP. Pt stated thankful for the call back.

## 2023-08-10 DIAGNOSIS — L570 Actinic keratosis: Secondary | ICD-10-CM | POA: Diagnosis not present

## 2023-08-10 DIAGNOSIS — L239 Allergic contact dermatitis, unspecified cause: Secondary | ICD-10-CM | POA: Diagnosis not present

## 2023-08-15 DIAGNOSIS — Z0100 Encounter for examination of eyes and vision without abnormal findings: Secondary | ICD-10-CM | POA: Diagnosis not present

## 2023-08-16 ENCOUNTER — Telehealth: Payer: Self-pay | Admitting: Diagnostic Neuroimaging

## 2023-08-16 NOTE — Telephone Encounter (Signed)
 Pt called in to schedule appt .  Appt Scheduled

## 2023-08-21 DIAGNOSIS — H02889 Meibomian gland dysfunction of unspecified eye, unspecified eyelid: Secondary | ICD-10-CM | POA: Diagnosis not present

## 2023-08-21 DIAGNOSIS — H00015 Hordeolum externum left lower eyelid: Secondary | ICD-10-CM | POA: Diagnosis not present

## 2023-09-26 ENCOUNTER — Telehealth: Payer: Self-pay | Admitting: Diagnostic Neuroimaging

## 2023-09-26 DIAGNOSIS — J019 Acute sinusitis, unspecified: Secondary | ICD-10-CM | POA: Diagnosis not present

## 2023-09-26 DIAGNOSIS — J209 Acute bronchitis, unspecified: Secondary | ICD-10-CM | POA: Diagnosis not present

## 2023-09-26 NOTE — Telephone Encounter (Signed)
 Pt called wanting to discuss with nurse several questions about the MRI she last had. Pt wants to know what was it for and what was being looked at.

## 2023-09-27 DIAGNOSIS — H01009 Unspecified blepharitis unspecified eye, unspecified eyelid: Secondary | ICD-10-CM | POA: Diagnosis not present

## 2023-09-27 DIAGNOSIS — H16223 Keratoconjunctivitis sicca, not specified as Sjogren's, bilateral: Secondary | ICD-10-CM | POA: Diagnosis not present

## 2023-09-27 NOTE — Telephone Encounter (Signed)
 Spoke w/Pt regarding MRI she had. She is questioning what Dr. Margaret was looking for and why she had the MRI of brain and spine. Referred to visit notes and discussed his comment to r/o demyelinating disease (explained this) or other causes. Also discussed provider's recommendation discussing Raynaud's evaluation w/PCP and that it is usually with a rheumatology consult. Pt stated she had discussed but PCP was not in favor of a rheumatology consult at that time. Pt stated she will speak with PCP again about that. Pt voiced thankful for the call back and the explanations given.

## 2023-10-04 ENCOUNTER — Encounter: Payer: Self-pay | Admitting: Podiatry

## 2023-10-04 ENCOUNTER — Ambulatory Visit (INDEPENDENT_AMBULATORY_CARE_PROVIDER_SITE_OTHER): Admitting: Podiatry

## 2023-10-04 VITALS — Ht 62.0 in | Wt 105.0 lb

## 2023-10-04 DIAGNOSIS — G629 Polyneuropathy, unspecified: Secondary | ICD-10-CM

## 2023-10-04 DIAGNOSIS — I7300 Raynaud's syndrome without gangrene: Secondary | ICD-10-CM | POA: Diagnosis not present

## 2023-10-04 MED ORDER — GABAPENTIN 100 MG PO CAPS: 100.0000 mg | ORAL_CAPSULE | Freq: Three times a day (TID) | ORAL | 3 refills | Status: AC

## 2023-10-04 NOTE — Progress Notes (Signed)
 Subjective:   Patient ID: Alyssa Vaughn, female   DOB: 73 y.o.   MRN: [id number]   HPI Patient presents stating she is getting a lot of burning and tingling in her feet and some in her hands and starting last December she developed some deep discoloration in her digits left over right.  Patient has seen several different physicians including neurologist vein specialist family doctor and takes amlodipine and Cymbalta for the condition currently.  Patient does not smoke likes to be active   Review of Systems  All other systems reviewed and are negative.       Objective:  Physical Exam Vitals and nursing note reviewed.  Constitutional:      Appearance: She is well-developed.  Pulmonary:     Effort: Pulmonary effort is normal.  Musculoskeletal:        General: Normal range of motion.  Skin:    General: Skin is warm.  Neurological:     Mental Status: She is alert.     Neurovascular status was found to be intact with good digital flow and minimal coolness of the digits at this time.  States that the symptoms are not as bad in the summer as far as discoloration of the toes but the burning and tingling pain seems to be roughly the same.  Patient has good digital perfusion well-oriented x 3     Assessment:  Probability that this is some kind of Raynaud's syndrome or disease with possibility for some other form of compression or other condition with patient having had history of neck and back surgery     Plan:  H&P reviewed and I did order blood work arthritic profile to try to rule out any further pathology and I then went ahead and I am going to start her on gabapentin 100 mg at night and then possibly 1 morning midday as needed.  We will reevaluate in 4 weeks and see how she is progressing

## 2023-10-05 LAB — ARTHRITIS PANEL
Anti Nuclear Antibody (ANA): NEGATIVE
Rheumatoid fact SerPl-aCnc: 11 [IU]/mL (ref <14.0)
Sed Rate: 2 mm/h (ref 0–40)
Uric Acid: 3.4 mg/dL (ref 3.1–7.9)

## 2023-10-10 DIAGNOSIS — K5730 Diverticulosis of large intestine without perforation or abscess without bleeding: Secondary | ICD-10-CM | POA: Diagnosis not present

## 2023-10-10 DIAGNOSIS — Z1211 Encounter for screening for malignant neoplasm of colon: Secondary | ICD-10-CM | POA: Diagnosis not present

## 2023-10-10 DIAGNOSIS — D122 Benign neoplasm of ascending colon: Secondary | ICD-10-CM | POA: Diagnosis not present

## 2023-10-12 DIAGNOSIS — D122 Benign neoplasm of ascending colon: Secondary | ICD-10-CM | POA: Diagnosis not present

## 2023-10-24 ENCOUNTER — Telehealth: Payer: Self-pay | Admitting: Podiatry

## 2023-10-24 NOTE — Telephone Encounter (Signed)
 Checking on status of lab work. She state she had left a message a couple of weeks ago when she think she was transferred to the nurse line. Please advise.

## 2023-10-25 NOTE — Telephone Encounter (Signed)
 Pt's phone going straight to voice mail. Sent her a Clinical cytogeneticist message with your reply included. Thank you

## 2023-10-25 NOTE — Telephone Encounter (Signed)
Everything was normal.

## 2023-11-01 DIAGNOSIS — I700 Atherosclerosis of aorta: Secondary | ICD-10-CM | POA: Diagnosis not present

## 2023-11-01 DIAGNOSIS — Z8619 Personal history of other infectious and parasitic diseases: Secondary | ICD-10-CM | POA: Diagnosis not present

## 2023-11-01 DIAGNOSIS — E7800 Pure hypercholesterolemia, unspecified: Secondary | ICD-10-CM | POA: Diagnosis not present

## 2023-11-01 DIAGNOSIS — F339 Major depressive disorder, recurrent, unspecified: Secondary | ICD-10-CM | POA: Diagnosis not present

## 2023-11-01 DIAGNOSIS — E559 Vitamin D deficiency, unspecified: Secondary | ICD-10-CM | POA: Diagnosis not present

## 2023-11-01 DIAGNOSIS — Z0000 Encounter for general adult medical examination without abnormal findings: Secondary | ICD-10-CM | POA: Diagnosis not present

## 2023-11-16 DIAGNOSIS — R140 Abdominal distension (gaseous): Secondary | ICD-10-CM | POA: Diagnosis not present

## 2023-11-16 DIAGNOSIS — R1030 Lower abdominal pain, unspecified: Secondary | ICD-10-CM | POA: Diagnosis not present

## 2023-11-21 DIAGNOSIS — E7800 Pure hypercholesterolemia, unspecified: Secondary | ICD-10-CM | POA: Diagnosis not present

## 2023-11-21 DIAGNOSIS — H16223 Keratoconjunctivitis sicca, not specified as Sjogren's, bilateral: Secondary | ICD-10-CM | POA: Diagnosis not present

## 2023-11-21 DIAGNOSIS — E559 Vitamin D deficiency, unspecified: Secondary | ICD-10-CM | POA: Diagnosis not present

## 2023-11-24 DIAGNOSIS — K5909 Other constipation: Secondary | ICD-10-CM | POA: Diagnosis not present

## 2023-11-29 DIAGNOSIS — R140 Abdominal distension (gaseous): Secondary | ICD-10-CM | POA: Diagnosis not present

## 2023-11-29 DIAGNOSIS — R202 Paresthesia of skin: Secondary | ICD-10-CM | POA: Diagnosis not present

## 2023-11-29 DIAGNOSIS — K5900 Constipation, unspecified: Secondary | ICD-10-CM | POA: Diagnosis not present

## 2023-11-30 DIAGNOSIS — R140 Abdominal distension (gaseous): Secondary | ICD-10-CM | POA: Diagnosis not present

## 2023-12-07 DIAGNOSIS — L570 Actinic keratosis: Secondary | ICD-10-CM | POA: Diagnosis not present

## 2023-12-07 DIAGNOSIS — I788 Other diseases of capillaries: Secondary | ICD-10-CM | POA: Diagnosis not present

## 2023-12-07 DIAGNOSIS — D0472 Carcinoma in situ of skin of left lower limb, including hip: Secondary | ICD-10-CM | POA: Diagnosis not present

## 2023-12-13 DIAGNOSIS — Z124 Encounter for screening for malignant neoplasm of cervix: Secondary | ICD-10-CM | POA: Diagnosis not present

## 2023-12-13 DIAGNOSIS — R3989 Other symptoms and signs involving the genitourinary system: Secondary | ICD-10-CM | POA: Diagnosis not present

## 2023-12-13 DIAGNOSIS — H16223 Keratoconjunctivitis sicca, not specified as Sjogren's, bilateral: Secondary | ICD-10-CM | POA: Diagnosis not present

## 2023-12-13 DIAGNOSIS — Z01419 Encounter for gynecological examination (general) (routine) without abnormal findings: Secondary | ICD-10-CM | POA: Diagnosis not present

## 2023-12-13 DIAGNOSIS — N952 Postmenopausal atrophic vaginitis: Secondary | ICD-10-CM | POA: Diagnosis not present

## 2023-12-13 DIAGNOSIS — Z681 Body mass index (BMI) 19 or less, adult: Secondary | ICD-10-CM | POA: Diagnosis not present

## 2023-12-14 DIAGNOSIS — R140 Abdominal distension (gaseous): Secondary | ICD-10-CM | POA: Diagnosis not present

## 2023-12-14 DIAGNOSIS — Z860100 Personal history of colon polyps, unspecified: Secondary | ICD-10-CM | POA: Diagnosis not present

## 2023-12-22 DIAGNOSIS — D0472 Carcinoma in situ of skin of left lower limb, including hip: Secondary | ICD-10-CM | POA: Diagnosis not present

## 2023-12-27 ENCOUNTER — Other Ambulatory Visit: Payer: Self-pay

## 2023-12-27 ENCOUNTER — Emergency Department (HOSPITAL_BASED_OUTPATIENT_CLINIC_OR_DEPARTMENT_OTHER)
Admission: EM | Admit: 2023-12-27 | Discharge: 2023-12-27 | Disposition: A | Discharge status: Home / Self Care | Attending: Emergency Medicine | Admitting: Emergency Medicine

## 2023-12-27 ENCOUNTER — Ambulatory Visit: Admitting: Psychiatry

## 2023-12-27 ENCOUNTER — Emergency Department (HOSPITAL_BASED_OUTPATIENT_CLINIC_OR_DEPARTMENT_OTHER)

## 2023-12-27 DIAGNOSIS — K5900 Constipation, unspecified: Secondary | ICD-10-CM | POA: Insufficient documentation

## 2023-12-27 DIAGNOSIS — R109 Unspecified abdominal pain: Secondary | ICD-10-CM | POA: Diagnosis not present

## 2023-12-27 DIAGNOSIS — N3289 Other specified disorders of bladder: Secondary | ICD-10-CM | POA: Diagnosis not present

## 2023-12-27 LAB — URINALYSIS, ROUTINE W REFLEX MICROSCOPIC
Bilirubin Urine: NEGATIVE
Glucose, UA: NEGATIVE mg/dL
Hgb urine dipstick: NEGATIVE
Ketones, ur: NEGATIVE mg/dL
Leukocytes,Ua: NEGATIVE
Nitrite: NEGATIVE
Protein, ur: NEGATIVE mg/dL
Specific Gravity, Urine: 1.006 (ref 1.005–1.030)
pH: 6.5 (ref 5.0–8.0)

## 2023-12-27 LAB — CBC
HCT: 38.3 % (ref 36.0–46.0)
Hemoglobin: 13.3 g/dL (ref 12.0–15.0)
MCH: 32.4 pg (ref 26.0–34.0)
MCHC: 34.7 g/dL (ref 30.0–36.0)
MCV: 93.4 fL (ref 80.0–100.0)
Platelets: 223 K/uL (ref 150–400)
RBC: 4.1 MIL/uL (ref 3.87–5.11)
RDW: 12 % (ref 11.5–15.5)
WBC: 7.8 K/uL (ref 4.0–10.5)
nRBC: 0 % (ref 0.0–0.2)

## 2023-12-27 LAB — COMPREHENSIVE METABOLIC PANEL WITH GFR
ALT: 20 U/L (ref 0–44)
AST: 27 U/L (ref 15–41)
Albumin: 4.7 g/dL (ref 3.5–5.0)
Alkaline Phosphatase: 91 U/L (ref 38–126)
Anion gap: 13 (ref 5–15)
BUN: 18 mg/dL (ref 8–23)
CO2: 24 mmol/L (ref 22–32)
Calcium: 9.5 mg/dL (ref 8.9–10.3)
Chloride: 103 mmol/L (ref 98–111)
Creatinine, Ser: 0.71 mg/dL (ref 0.44–1.00)
GFR, Estimated: >60 mL/min (ref >60)
Glucose, Bld: 104 mg/dL — ABNORMAL HIGH (ref 70–99)
Potassium: 3.5 mmol/L (ref 3.5–5.1)
Sodium: 140 mmol/L (ref 135–145)
Total Bilirubin: 0.6 mg/dL (ref 0.0–1.2)
Total Protein: 7 g/dL (ref 6.5–8.1)

## 2023-12-27 LAB — LIPASE, BLOOD: Lipase: 29 U/L (ref 11–51)

## 2023-12-27 MED ORDER — IOHEXOL 300 MG/ML  SOLN
80.0000 mL | Freq: Once | INTRAMUSCULAR | Status: AC | PRN
  Administered 2023-12-27: 80 mL via INTRAVENOUS

## 2023-12-27 NOTE — ED Provider Notes (Signed)
  EMERGENCY DEPARTMENT AT [facility] Provider Note   CSN: [id number] Arrival date & time: 12/27/23  1233     Patient presents with: Abdominal Pain   Alyssa Vaughn is a 73 y.o. female.   Patient with no pertinent past medical history presents today with complaints of abdominal pain.  She reports that in July she had a colonoscopy done without complication.  They did find 1 polyp which they removed.  Since then she has felt like her abdomen was more distended.  She reports that 2 weeks ago she saw her gastroenterologist Dr. Burnette who told her to do a second colonoscopy prep cleanout.  She did this and felt like she had a large bowel movement, however her distention and discomfort did not improve.  Since then, she has been taking a capful of MiraLAX twice a day per GIs recommendations.  She reports that she has had more frequent stools that are softer than normal but are not a very large volume.  Despite this, she continues to have distention and discomfort.   She reports her pain feels like a burning sensation throughout her abdomen. Denies any urinary symptoms or skin changes.  Reports some nausea without vomiting.  Reports history of appendectomy, no other history of abdominal surgeries.  Pain is worse on the right side.  Denies history of similar pain previously.  Denies hematochezia or melena.  She is not on any narcotic pain medicines.  The history is provided by the patient. No language interpreter was used.  Abdominal Pain Associated symptoms: nausea        Prior to Admission medications   Medication Sig Start Date End Date Taking? Authorizing Provider  ALPRAZolam  (XANAX ) 0.5 MG tablet TAKE 1/2 TO 1 (ONE-HALF TO ONE) TABLET BY MOUTH AT BEDTIME AS NEEDED FOR ANXIETY OR SLEEP 06/21/23   Cottle, Lorene KANDICE Raddle., MD  calcium carbonate (SUPER CALCIUM) 1500 (600 Ca) MG TABS tablet 1 tablet with meals Orally once a day    [provider]  DULoxetine (CYMBALTA) 20  MG capsule Take 20 mg by mouth daily. 05/11/23   [provider]  erythromycin ophthalmic ointment 1 application into the lower eyelid of affected eye Ophthalmic Four times a day for 7 days 03/31/23   [provider]  gabapentin  (NEURONTIN ) 100 MG capsule Take 1 capsule (100 mg total) by mouth 3 (three) times daily. 10/04/23   Regal, Pasco RAMAN, DPM  MIEBO 1.338 GM/ML SOLN 1 drop into affected eye  as needed Ophthalmic Four times a day    [provider]  Omega-3 1000 MG CAPS Take by mouth.    [provider]  omeprazole (PRILOSEC) 20 MG capsule 1 capsule 30 minutes before morning meal Orally Once a day as needed for 90 days 10/18/21   [provider]  vitamin C (ASCORBIC ACID) 500 MG tablet Take 500 mg by mouth daily.    [provider]    Allergies: Biaxin [clarithromycin], Bupropion , Cyclobenzaprine, Desvenlafaxine, Metaxalone, Quetiapine, Amoxicillin, Ibuprofen, and Nsaids    Review of Systems  Gastrointestinal:  Positive for abdominal pain and nausea.  All other systems reviewed and are negative.   Updated Vital Signs BP (!) 151/79   Pulse 69   Temp 98.2 F (36.8 C) (Oral)   Resp 18   Ht 5' 2 (1.575 m)   Wt 47.6 kg   SpO2 100%   BMI 19.20 kg/m   Physical Exam Vitals and nursing note reviewed.  Constitutional:  General: She is not in acute distress.    Appearance: Normal appearance. She is normal weight. She is not ill-appearing, toxic-appearing or diaphoretic.  HENT:     Head: Normocephalic and atraumatic.  Cardiovascular:     Rate and Rhythm: Normal rate.  Pulmonary:     Effort: Pulmonary effort is normal. No respiratory distress.  Abdominal:     General: There is distension.     Palpations: Abdomen is soft. There is no fluid wave.     Tenderness: There is generalized abdominal tenderness.  Musculoskeletal:        General: Normal range of motion.     Cervical back: Normal range of motion.  Skin:    General: Skin  is warm and dry.  Neurological:     General: No focal deficit present.     Mental Status: She is alert.  Psychiatric:        Mood and Affect: Mood normal.        Behavior: Behavior normal.     (all labs ordered are listed, but only abnormal results are displayed) Labs Reviewed  COMPREHENSIVE METABOLIC PANEL WITH GFR - Abnormal; Notable for the following components:      Result Value   Glucose, Bld 104 (*)    All other components within normal limits  URINALYSIS, ROUTINE W REFLEX MICROSCOPIC - Abnormal; Notable for the following components:   Color, Urine COLORLESS (*)    All other components within normal limits  LIPASE, BLOOD  CBC    EKG: None  Radiology: CT ABDOMEN PELVIS W CONTRAST Result Date: 12/27/2023 CLINICAL DATA:  Abdominal pain, acute, nonlocalized EXAM: CT ABDOMEN AND PELVIS WITH CONTRAST TECHNIQUE: Multidetector CT imaging of the abdomen and pelvis was performed using the standard protocol following bolus administration of intravenous contrast. RADIATION DOSE REDUCTION: This exam was performed according to the departmental dose-optimization program which includes automated exposure control, adjustment of the mA and/or kV according to patient size and/or use of iterative reconstruction technique. CONTRAST:  80mL OMNIPAQUE  IOHEXOL  300 MG/ML  SOLN COMPARISON:  04/31/2023 FINDINGS: Lower chest: No focal airspace consolidation or pleural effusion.Posterior bibasilar dependent atelectasis. Hepatobiliary: No mass.No radiopaque stones or wall thickening of the gallbladder. No intrahepatic or extrahepatic biliary ductal dilation. The portal veins are patent. Pancreas: No mass or main ductal dilation. No peripancreatic inflammation or fluid collection. Spleen: Normal size. No mass. Adrenals/Urinary Tract: No adrenal masses. Subcentimeter hypodensity noted in the right kidney, too small to definitively characterize, but likely a small cyst. No nephrolithiasis or hydronephrosis. The  urinary bladder is distended without focal abnormality. Stomach/Bowel: The stomach is decompressed without focal abnormality. No small bowel wall thickening or inflammation. No small bowel obstruction.The appendix was not visualized. No right lower quadrant or pericecal inflammatory changes to suggest acute appendicitis. Large volume fecal loading throughout the colon. Vascular/Lymphatic: No aortic aneurysm. Scattered aortoiliac atherosclerosis. No intraabdominal or pelvic lymphadenopathy. Reproductive: Age-related atrophy of the uterus and ovaries. No concerning adnexal mass.No free pelvic fluid. Other: No pneumoperitoneum, ascites, or mesenteric inflammation. Musculoskeletal: No acute fracture or destructive lesion. 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 Osteopenia. IMPRESSION: Large volume fecal loading throughout the colon, as can be seen in constipation. Otherwise, no acute abnormality within the abdomen or pelvis. Aortic Atherosclerosis (ICD10-I70.0). Electronically Signed   By: Rogelia Myers M.D.   On: 12/27/2023 17:50     Procedures   Medications Ordered in the ED  iohexol  (OMNIPAQUE ) 300 MG/ML solution 80 mL (80 mLs Intravenous Contrast Given  12/27/23 1710)                                    Medical Decision Making Amount and/or Complexity of Data Reviewed Labs: ordered. Radiology: ordered.  Risk Prescription drug management.   This patient is a 73 y.o. female who presents to the ED for concern of abdominal distention, pain, this involves an extensive number of treatment options, and is a complaint that carries with it a high risk of complications and morbidity. The emergent differential diagnosis prior to evaluation includes, but is not limited to,  AAA, gastroenteritis, appendicitis, Bowel obstruction, Bowel perforation. Gastroparesis, DKA, Hernia, Inflammatory bowel disease, mesenteric ischemia, pancreatitis, peritonitis SBP, volvulus.  This is not an  exhaustive differential.   Past Medical History / Co-morbidities / Social History:  has a past medical history of Arthritis.  Additional history: Chart reviewed. Pertinent results include: Follows with Dr. Burnette with [facility], does look like she reached out to them today.  Unfortunately I cannot reviewed notes from them.  Physical Exam: Physical exam performed. The pertinent findings include: Very well-appearing, does have some abdominal distention, however her abdomen is soft and not significantly tender.  No rebound or guarding.  No fluid wave.  Lab Tests: I ordered, and personally interpreted labs.  The pertinent results include: No acute laboratory abnormalities.   Imaging Studies: I ordered imaging studies including CT abdomen pelvis. I independently visualized and interpreted imaging which showed   Large volume fecal loading throughout the colon, as can be seen in constipation. Otherwise, no acute abnormality within the abdomen or pelvis.   Aortic Atherosclerosis  I agree with the radiologist interpretation.   Medications: Offered pain/nausea medication which patient declined. Also offered enema/digital disimpaction which patient declined   Disposition: After consideration of the diagnostic results and the patients response to treatment, I feel that emergency department workup does not suggest an emergent condition requiring admission or immediate intervention beyond what has been performed at this time. The plan is: Discharge with instructions for MiraLAX bowel cleanout with close GI follow-up and return precautions.  Discussed CT findings with patient that are consistent with constipation.  She expressed understanding.  Instructions given for bowel regimen.  Reports she has good following with her GI Dr. Burnette as well as her PCP, recommend that she reach out to both to discuss management of her symptoms. Evaluation and diagnostic testing in the emergency department does not  suggest an emergent condition requiring admission or immediate intervention beyond what has been performed at this time.  Plan for discharge with close PCP follow-up.  Patient is understanding and amenable with plan, educated on red flag symptoms that would prompt immediate return.  Patient discharged in stable condition.  Findings and plan of care discussed with supervising physician Dr. Ruthe who is in agreement.   Final diagnoses:  Constipation, unspecified constipation type    ED Discharge Orders     None     An After Visit Summary was printed and given to the patient.      Nora Lauraine DELENA DEVONNA 12/27/23 1906    Ruthe Cornet, DO 12/27/23 2241

## 2023-12-27 NOTE — Discharge Instructions (Signed)
 As we discussed, your workup in the ER today was reassuring for acute findings.  Laboratory evaluation and CT imaging did not reveal any emergent cause of your symptoms.  However it does look like you have quite a lot of stool throughout your intestinal tract which is consistent with constipation.  I recommend that you do a bowel cleanout for management of this.  I recommend mixing 8 capfuls of MiraLAX into a drink such as Gatorade.  Drink this in 1 hour on a day where you dont have plans and expect significant bowel movements throughout the day ending in liquid stools.  If in a few hours you have not had any results, you can add in Ex-Lax chocolate as well as a fleets enema which you can get over-the-counter at your pharmacy.  You may continue forward with 1 capfull of Miralax twice a day if difficulties with bowel movements continue.   I recommend calling your GI doctor for follow-up evaluation and management of the symptoms.  You can also call your PCP.  Return if development of any new or worsening symptoms.

## 2023-12-27 NOTE — ED Notes (Signed)
 Call to lab regarding urine previously sent to lab being ran

## 2023-12-27 NOTE — ED Triage Notes (Signed)
 Pt Alyssa Vaughn c/o R side abd pain and abd distension, stating she had colonoscopy done a few months ago and has had distension and constipation issues since. Pt states discomfort has worsened over the past week or so.

## 2024-01-01 DIAGNOSIS — W19XXXA Unspecified fall, initial encounter: Secondary | ICD-10-CM | POA: Diagnosis not present

## 2024-01-01 DIAGNOSIS — R0781 Pleurodynia: Secondary | ICD-10-CM | POA: Diagnosis not present

## 2024-01-01 DIAGNOSIS — W19XXXD Unspecified fall, subsequent encounter: Secondary | ICD-10-CM | POA: Diagnosis not present

## 2024-01-01 DIAGNOSIS — I10 Essential (primary) hypertension: Secondary | ICD-10-CM | POA: Diagnosis not present

## 2024-01-01 DIAGNOSIS — S20219A Contusion of unspecified front wall of thorax, initial encounter: Secondary | ICD-10-CM | POA: Diagnosis not present

## 2024-01-01 DIAGNOSIS — K5900 Constipation, unspecified: Secondary | ICD-10-CM | POA: Diagnosis not present

## 2024-01-01 DIAGNOSIS — L719 Rosacea, unspecified: Secondary | ICD-10-CM | POA: Diagnosis not present

## 2024-01-01 DIAGNOSIS — K5909 Other constipation: Secondary | ICD-10-CM | POA: Diagnosis not present

## 2024-01-01 DIAGNOSIS — Y92009 Unspecified place in unspecified non-institutional (private) residence as the place of occurrence of the external cause: Secondary | ICD-10-CM | POA: Diagnosis not present

## 2024-01-01 DIAGNOSIS — M546 Pain in thoracic spine: Secondary | ICD-10-CM | POA: Diagnosis not present

## 2024-01-01 DIAGNOSIS — S3992XA Unspecified injury of lower back, initial encounter: Secondary | ICD-10-CM | POA: Diagnosis not present

## 2024-01-11 DIAGNOSIS — H52223 Regular astigmatism, bilateral: Secondary | ICD-10-CM | POA: Diagnosis not present

## 2024-01-15 ENCOUNTER — Other Ambulatory Visit: Payer: Self-pay | Admitting: Student

## 2024-01-15 DIAGNOSIS — R1011 Right upper quadrant pain: Secondary | ICD-10-CM | POA: Diagnosis not present

## 2024-01-15 DIAGNOSIS — K5909 Other constipation: Secondary | ICD-10-CM

## 2024-01-15 DIAGNOSIS — K5900 Constipation, unspecified: Secondary | ICD-10-CM | POA: Diagnosis not present

## 2024-01-22 ENCOUNTER — Ambulatory Visit
Admission: RE | Admit: 2024-01-22 | Discharge: 2024-01-22 | Disposition: A | Discharge status: Home / Self Care | Source: Ambulatory Visit | Attending: Student | Admitting: Student

## 2024-01-22 DIAGNOSIS — K8020 Calculus of gallbladder without cholecystitis without obstruction: Secondary | ICD-10-CM | POA: Diagnosis not present

## 2024-01-22 DIAGNOSIS — R1011 Right upper quadrant pain: Secondary | ICD-10-CM

## 2024-01-22 DIAGNOSIS — K5909 Other constipation: Secondary | ICD-10-CM

## 2024-01-26 DIAGNOSIS — D485 Neoplasm of uncertain behavior of skin: Secondary | ICD-10-CM | POA: Diagnosis not present

## 2024-01-26 DIAGNOSIS — L720 Epidermal cyst: Secondary | ICD-10-CM | POA: Diagnosis not present

## 2024-01-26 DIAGNOSIS — L570 Actinic keratosis: Secondary | ICD-10-CM | POA: Diagnosis not present

## 2024-02-03 DIAGNOSIS — R079 Chest pain, unspecified: Secondary | ICD-10-CM | POA: Diagnosis not present

## 2024-02-06 DIAGNOSIS — I10 Essential (primary) hypertension: Secondary | ICD-10-CM | POA: Diagnosis not present

## 2024-02-06 DIAGNOSIS — F418 Other specified anxiety disorders: Secondary | ICD-10-CM | POA: Diagnosis not present

## 2024-02-06 DIAGNOSIS — Z23 Encounter for immunization: Secondary | ICD-10-CM | POA: Diagnosis not present

## 2024-02-06 DIAGNOSIS — K5900 Constipation, unspecified: Secondary | ICD-10-CM | POA: Diagnosis not present

## 2024-02-15 DIAGNOSIS — Z1231 Encounter for screening mammogram for malignant neoplasm of breast: Secondary | ICD-10-CM | POA: Diagnosis not present

## 2024-03-04 ENCOUNTER — Ambulatory Visit: Admitting: Psychiatry

## 2024-03-04 ENCOUNTER — Encounter: Payer: Self-pay | Admitting: Psychiatry

## 2024-03-04 ENCOUNTER — Other Ambulatory Visit: Payer: Self-pay | Admitting: Psychiatry

## 2024-03-04 DIAGNOSIS — F5105 Insomnia due to other mental disorder: Secondary | ICD-10-CM | POA: Diagnosis not present

## 2024-03-04 DIAGNOSIS — F339 Major depressive disorder, recurrent, unspecified: Secondary | ICD-10-CM

## 2024-03-04 DIAGNOSIS — F411 Generalized anxiety disorder: Secondary | ICD-10-CM

## 2024-03-04 DIAGNOSIS — F900 Attention-deficit hyperactivity disorder, predominantly inattentive type: Secondary | ICD-10-CM | POA: Diagnosis not present

## 2024-03-04 MED ORDER — DULOXETINE HCL 20 MG PO CPEP: ORAL_CAPSULE | ORAL | 0 refills | Status: AC

## 2024-03-04 NOTE — Progress Notes (Signed)
 Alyssa Vaughn [id number] 1950-12-12 73 y.o.    Subjective:   Patient ID:  Alyssa Vaughn is a 73 y.o. (DOB 05-May-1950) female.  Chief Complaint:  No chief complaint on file.   [name] presents to the office today for follow-up of increased fluoxetine  for worsening depression without reason.    seen October 2020.  No meds were changed.  Fluoxetine  20 mg was continued.  07/09/2019 appointment the following is noted: Wants to try Quillivent bc easily confused, distracted and unfocused as noted. Good overall.  Fluoxetine  helps anxiety and tears. Only taking Xanax  1/2 tablet at night.   Still ADD and head does not feel awake.  Hard to concentrate with groups.  But also doesn't feel quite awake and alert usually.   Feel like my brain won't wake up.  Tried modafinil  50 mg but nervous so much couldn't evaluate effect on ADD.  Has tried lots of ADD meds and didn't tolerate them. Grand kids in best.  [name] murdered but he hasn't been in the picture.  Pleased she's been active.  Not generally self-motivated and pleased she's stayed busy. 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  I just have to let it go.   Can't focus on [place].  She periodically struggles with sadness over the brokenness in the relationship.  Recognizes she craves his attention.  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  Don't need clonazepam at any other time.  Patient denies difficulty with sleep initiation or maintenance. 7-8 hours.  Denies appetite disturbance.  Patient reports that energy and motivation have been good.  Patient denies any suicidal ideation.  No longer depressed usually and anxious but episodic.  Klonopin good once daily for anxiety.  Xanax  for sleep ok. Plan: Start Quillivant  1 ml daily and may increase by 1 ml weekly as tolerated up to about 3-4 ml  09/03/2019 phone call that the Larene was too expensive  and wanted a prescription for Ritalin  5 mg which was sent.  09/09/2019 appointment with the following noted: Ritalin  helpful.  takiing up to BID and it makes a big difference in focus and productivity. Tolerating it well.   Taking a little alprazolam  at night usually about 1/4 of 0.5 mg night.y.  Not taking clonazepam. Asks about taper BZ. Overall feels the best she's been.  After years of divorced she feels more free. Less fear spiritually now. No med changes  03/10/20 appt with following noted: Not taking stimulants. Taking 0.125 mg alprazolam  HS.  Makes her sleepy a little.  Not sure if she needs it.  Wonders about stopping .   Overall good and bad.  Up and down mood with a lot of back problems which she's working on.  Sometimes unmanageable. Low days fight with herself, tired and uninterested.  Stress and obsess over [place]. Divorced 2008 and still not over it.  Still no life for herself. Don't get involved with things.  Only looks forward to popcorn and going to bed early.  Everything is drudgery and stay unmotivated. These sx come and go.  It's always there in the background but waxes and wanes.   Working on it. Plan no changes and continue fluoxetine  and Ritalin .  06/02/2020 appointment with following noted: Not taking Ritalin .  Continued fluoxetine .   Tried to address her issues with Manus.  Other stuff still there with cry9ing and hard to focus and get through the day.  Weary of trying meds.  Trying to work on herself with self help.  Tried diet and exercise without change in sx.  Fear of going out witiut a reason.  Feels stuck. Last filled Ritalin  Augst 2021. Still depressed.  Struggles to get things done. Recent normal physical.  Plan:  Potentiate Abilify  2 mg tablet 1/2 daily for 2 weeks, then 1 tablet for sig depression  07/21/2020 appt noted: Good with aripiprazole  it helped a lot noticeably, smooth Taking Abilify  2 mg daily. Got rid of heavy feeling of depresssion.  No crying.   Easier to do things and go places No SE with meds. Restarted Ritalin  5 mg TID too low.  Wants ER. Needs equivalent of 10 TID.  Helps the confusion of the day and improves focus and completion.  Good combo with Abilify . Plan:  Switch to LA Concerta  36 mg AM.  Call if too much.  09/01/2020 appointment noted: She called a couple times since being here and found both the 36 mg size and the 27 mg size of Concerta  to be too much including symptoms of tension and anxiety and wanted to switch back to the Ritalin  5 mg 3 times daily.  She was offered the option of going to the lowest dose of Concerta  18 mg but declined. Ritalin  5 TID not very effective.  Would like to try lower dose of Concerta . Mood still very good and unusual for me.  Benefit Abilify .  Working on herself.   Plan for depression recommend potentiation with Abilify  up to 2 mg daily.  Sleep is good.    03/03/2021 appointment with the following noted: Not taking Ritalin  DT SE and up/down feelings from it.  It is helpful but not necessary. Saw benefit with Abilify  in the beginning but didn't feel like herself.  Helped the depression and easier to go out and do things. Took the Abilify  for 3-4 mos.  , Overall doing OK.  Better situation in the family with good grandparenting with Manus and does things with Hagaman socially and it helped.  Better sense of focus and direction for her life and less demanding of him.  Good relationship with Manus and the family.  Settling more into retirement.  Anxiety manageable. Exercise more consistent.  07/28/21 appt noted: Taking fluoxetine  20 and Ritalin  5 mg TID. Without Ritalin  sliding back into cycle of sadness, crying, poor focus.  Started Ritalin  back and more motivation and concentration.. Moved appt up. Still struggles with sadness and low interest and energy and low self esteem.  No SI.  Persistent depression and she fights it with diet and exercise and activity.  Feels heavy Asked about  retring Wellbutrin  Plan: Reduce fluoxetine  to 10 mg daily.  Call if any SE.  Add wellbutrin  XL 150 mg daily for depression and stop stimulant.  08/24/21 appt noted: It failed.  Couldn't tolerate it for a week bc dry eyes re: wellbutrin . So decided to retry Abilify  for mood. 2 mg daily and increased fluoxetine  20 mg daily. More steady and less down and more motivated with Abilify  2 daily.   SE a little foggy and hope for no weight gain. Ritalin  a little drying but asks about retrying dextrostat .  Not taking stimulant currently.  +12/01/21 appt noted: Aleene has Covid and she was exposed. Has continued fluoxetine  20 mg daily without Abilify .  Stopped it DT tremor R hand. Added Ritalin  5 mg BID is usual.  Seems adequate duration.  Leveled things out with distraction, attn is much better.  Able to calm down and be present.  Tolerating it fine so far.  Using the generic from [facility] and tolerates it better than others she takes.  Yellow and the white one have been tolerated.   Mood is good  though initially worse off Abilify .  No crying now.  Mood smoother on Abilify . Plan: Still takes Xanax  0.125-0.25 HS Continue fluoxetine  20 mg daily.  Call if any SE.  hold Abilify  2 mg daily helped depression but tremor.  Consider Rexulti if needed Continue Ritalin  5 mg BID-TID  05/11/22 appt noted: Doing ok since then. Nov eyes got dry with Ritalin  and stopped and retried.  Off for a month. Calmer and more centered in present and motivated with stimulant than without it.  Also less blah. Mood a little down but overall steady. Frustrated with some medical problems and back pain.   No concerns with current meds.   Sleep is good overall except for rib pain lately. Plan: Still takes Xanax  0.125-0.25 HS Continue fluoxetine  20 mg daily.  Call if any SE.  hold Abilify  2 mg daily helped depression but tremor.   Trial modafinil  50 mg AM  08/02/22  appt noted: No modafinil  DT HA. Not taking dextrostat  DT heart  racing even at 2.5 mg. More crying lately for 2 mos.  Hard to get through the day.  Heavy feeling and can't see reason for the crying.   Stimulant helped motivation but mood is worse off it. Decided prozac  not working and reduced it to 10 mg daily. She's been researching and thinks what she is missing is DA.  Taking Velvet bean OTC which is supposed to help but no effect noted.   Plan: Disc off label for depression and SE: Pramipexole  1/2 tablet in the AM for 3 days,  Then 1 tablet each AM for 3 days,  Then 1 in the AM and 1/2 tablet in PM for 3 days, Then 1 twice daily  5/29/234 appt noted: Didn't tolerate the pramipexole  at 0.25 mg tablet bc dizziness. Then called and asked to start Wellbutrin  which caused HA and dry eyes. On no psych med at this time.  Stopped fluoxetine  for a couple of weeks. Asks about Paxil  retrial but didn't tolerate it before. Dep, sad, cry, wants to isolate, reduced interest, anxious.  No SI Plan; Plan paroxetine  susp 10mg /66ml 1 ml daily for 1 week then 2 ml daily Call 3rd week of June if not better and we'll increase the dose  09/23/22 TC:  Patient reporting that the Paxil  5 mg has been effective for the crying spells and overall has gotten her out of the dark spot. She would like to increase dose to 10 mg. Reporting no SE with the 5 mg dose, which was started on 5/31. Has FU 7/10.      10/12/2022 appointment noted: She reduced the dose paroxetine  to 5 mg daily from 10 mg DT dry eyes.  But she thinks it might make her irritable.  Only took 10 mg a couple of days.   Eye doc dx ocular rosacea.  Given some drops but hasn't started all the drops yet.  One more new drop coming Monday which might help eyes. Now worries med might be causing rosacea. Now ambivalent about the paroxetine . She thought paroxetine  helped dep and crying initially but it has returned again.  No reason for the depression.   Plan:  She wants to stop paroxetine  DT dry eyes.  She worries it  might worsen rosacea.  Stop it and don't start anything new until this is addressed.  Can restart paroxetine  and go back to 10 mg daily. Other option is Trintellix which is unlikely to cause dryness.  Gave samples 5 mg and disc se  12/07/22 appt noted: She reduced paroxetine  to 5 mg daily.  Off it for a week or 2 then resumed paroxetine  and 8/10 started paroxetine  10 mg daily.  Crying is better. Usually first thing that improves on med. Eyes are a separate issue.   Pushed herself to stay on paroxetine . Anxiety is ok and low lately.  Easier to accept things, less easily upset. Still some dep sx.  Still trouble with lacking Zest for living.  Low grade dep not looking forward to much but then likes it when doing it.   Still keeps GD 8 hours 3 days per week. Attends church.  Not oversleeping as much as she was.   Xanax  0.25 mg HS helps sleep.  06/21/23 appt noted: Med: duloxetine 20, alprazolam  0.25 mg HS.  Stopped paroxetine  in October.   Still crying some at 5-10 mg daily. Since then a whole lot of mess.   For years had whole body tingling UE and LE but not severe.   In Dec it got a lot worse and affected calves and feet turned purple.  They hurt and burn now.  No numbness.  Saw neuro PCP started duloxetine 20 mg daily 6 weeks ago.  Also dealing with dry eye syndrome and taking eye drop.   Thinks duloxetine helped some with UE sx.   Also helped mood and anxiety. MRI tomorrow.  03/04/24 appt noted: Med: duloxetine 20, alprazolam  0.25 mg HS.  Stopped paroxetine  in October.   Duloxetine from PCP [facility].  She doesn't like RX psych meds.  Wondered about duloxetine for numbness tingling in legs.  No pain like before but also added amlodipine for venous insufficiency. Still kind of dep, just down.  Not anxious .  Fighting dep, no reason to be down but it is still always there. Still dry eye syndrome.  Reading a book on the Now Effect dealing with mood.    Past Psychiatric  Medication Trials:  Modafinil  SE, methylphenidate  tablets,  , Dextrostat , Adderall, Ritalin , Vyvanse, Evekeo, Concerta  27 SE, Ritalin  5 mg BID tolerated [employer]fg [employer], dry eyes Modafinil  50 HA Strattera,  Deplin,   Bupropion  37.5 mg SE HA and dry eyes Fluoxetine  20, 10 NR, Lexapro, sertraline 37.5 side effects,  Paroxetine10 , citalopram,  venlafaxine, duloxetine 40, Pristiq,  Very brief Trintellix 5  Pramipexole  0.25 mg dizzy buspirone,  Abilify  2 marked benefit but CO some SE spacy and stopped.  Clonazepam sed, Xanax ,    Hydroxyzine Last counseling years ago.  Review of Systems:  Review of Systems  Cardiovascular:  Negative for chest pain and palpitations.  Musculoskeletal:  Positive for back pain.  Neurological:  Negative for tremors and weakness.  Psychiatric/Behavioral:  Positive for decreased concentration and dysphoric mood. Negative for agitation, behavioral problems, confusion, hallucinations, self-injury, sleep disturbance and suicidal ideas. The patient is not nervous/anxious and is not hyperactive.     Medications: I have reviewed the patient's current medications.  Current Outpatient Medications  Medication Sig Dispense Refill   ALPRAZolam  (XANAX ) 0.5 MG tablet TAKE 1/2 TO 1 (ONE-HALF TO ONE) TABLET BY MOUTH AT BEDTIME AS NEEDED FOR ANXIETY OR SLEEP 30 tablet 5   calcium carbonate (SUPER CALCIUM) 1500 (600 Ca) MG TABS tablet 1 tablet with meals Orally once  a day     DULoxetine  (CYMBALTA ) 20 MG capsule Take 20 mg by mouth daily.     erythromycin ophthalmic ointment 1 application into the lower eyelid of affected eye Ophthalmic Four times a day for 7 days     gabapentin  (NEURONTIN ) 100 MG capsule Take 1 capsule (100 mg total) by mouth 3 (three) times daily. 90 capsule 3   MIEBO 1.338 GM/ML SOLN 1 drop into affected eye  as needed Ophthalmic Four times a day     Omega-3 1000 MG CAPS Take by mouth.     omeprazole (PRILOSEC) 20 MG capsule 1 capsule 30 minutes before  morning meal Orally Once a day as needed for 90 days     vitamin C (ASCORBIC ACID) 500 MG tablet Take 500 mg by mouth daily.     No current facility-administered medications for this visit.    Medication Side Effects: None  Allergies:  Allergies  Allergen Reactions   Biaxin [Clarithromycin]     Upset stomach   Bupropion      Other Reaction(s): dizziness   Cyclobenzaprine     Other Reaction(s): swollen eyes   Desvenlafaxine     Other Reaction(s): Memory loss   Metaxalone Nausea Only   Quetiapine     Other Reaction(s): sleepy   Amoxicillin Rash   Ibuprofen Rash   Nsaids Rash    Past Medical History:  Diagnosis Date   Arthritis     No family history on file.  History, Surgical history, Social history, and Family history were reviewed and updated as appropriate.   Please see review of systems for further details on the patient's review from today.   Objective:   Physical Exam:  There were no vitals taken for this visit.  Physical Exam Constitutional:      General: She is not in acute distress.    Appearance: She is well-developed.  Musculoskeletal:        General: No deformity.  Neurological:     Mental Status: She is alert and oriented to person, place, and time.     Motor: No tremor.     Coordination: Coordination normal.     Gait: Gait normal.  Psychiatric:        Attention and Perception: Perception normal. She is attentive.        Mood and Affect: Mood is depressed. Mood is not anxious. Affect is not labile, angry, tearful or inappropriate.        Speech: Speech normal.        Behavior: Behavior normal.        Thought Content: Thought content normal. Thought content is not delusional. Thought content does not include homicidal or suicidal ideation. Thought content does not include suicidal plan.        Cognition and Memory: Cognition normal.        Judgment: Judgment normal.     Comments: Insight and judgment good. No auditory or visual hallucinations.  No delusions.  Chronic dep without anxiety        Lab Review:     Component Value Date/Time   NA 140 12/27/2023 1320   K 3.5 12/27/2023 1320   CL 103 12/27/2023 1320   CO2 24 12/27/2023 1320   GLUCOSE 104 (H) 12/27/2023 1320   BUN 18 12/27/2023 1320   CREATININE 0.71 12/27/2023 1320   CALCIUM 9.5 12/27/2023 1320   PROT 7.0 12/27/2023 1320   PROT 6.2 05/30/2023 1452   ALBUMIN 4.7 12/27/2023 1320   AST 27  12/27/2023 1320   ALT 20 12/27/2023 1320   ALKPHOS 91 12/27/2023 1320   BILITOT 0.6 12/27/2023 1320   GFRNONAA >60 12/27/2023 1320   GFRAA >60 09/05/2017 1535       Component Value Date/Time   WBC 7.8 12/27/2023 1320   RBC 4.10 12/27/2023 1320   HGB 13.3 12/27/2023 1320   HCT 38.3 12/27/2023 1320   PLT 223 12/27/2023 1320   MCV 93.4 12/27/2023 1320   MCH 32.4 12/27/2023 1320   MCHC 34.7 12/27/2023 1320   RDW 12.0 12/27/2023 1320   LYMPHSABS 2.1 01/11/2009 1400   MONOABS 0.7 01/11/2009 1400   EOSABS 0.0 01/11/2009 1400   BASOSABS 0.0 01/11/2009 1400    No results found for: POCLITH, LITHIUM   No results found for: PHENYTOIN, PHENOBARB, VALPROATE, CBMZ   .res Assessment: Plan:    No diagnosis found.   30 min face to face time with patient was spent on counseling and coordination of care. We discussed overall depression better .  Problems with chronic dep and ADD Disc med sensitivity, discussed her concerns in general fearfulness around medications.   Benefit for mood and anxiety and neuropathy with duloxetine 20 mg daily. Disc importance of dosing and range but she's med sensitive and getting benefit at low dose.  Consider alternative Trintellix but it might not be affordable.    More ADD sx without stimulant.  Few other options other than modafinil  off label.  Unless she gets eye doctor to solve dry eyes.  She has disc this problem with them before.  Long history of dry eyes. No current stimulant DT dry eys.  We discussed the short-term  risks associated with benzodiazepines including sedation and increased fall risk among others.  Discussed long-term side effect risk including dependence, potential withdrawal symptoms, and the potential eventual dose-related risk of dementia.  But recent studies from 2020 dispute this association between benzodiazepines and dementia risk. Newer studies in 2020 do not support an association with dementia.  Still takes Xanax  0.125-0.25 HS   Option she never tried:  (NAC) N-Acetylcysteine 2 of the  600 mg capsules daily to help with mild cognitive problems.  It can be combined with a B-complex vitamin as the B-12 and folate which can sometimes enhance the effect.  Disc option of TMS extensively.  She doesn't want to do this now. Option lithium; she is not interested. Option Trintellix retry or Viibryd, lamotrigine  She wants to try Trintellix again 5 mg and taper off duloxetine 20 mg for a week and then stop  FU 2 mos  Lorene Macintosh, MD, DFAPA   Please see After Visit Summary for patient specific instructions.  Future Appointments  Date Time Provider Department Center  03/25/2024  4:00 PM Penumalli, Eduard SAUNDERS, MD [facility] None      No orders of the defined types were placed in this encounter.     -------------------------------

## 2024-03-04 NOTE — Patient Instructions (Signed)
 Reduce duloxetine to 20 mg daily for 1 week and stop it. Go ahead and start Trintellix 5 mg daily

## 2024-03-08 ENCOUNTER — Other Ambulatory Visit: Payer: Self-pay | Admitting: Psychiatry

## 2024-03-08 DIAGNOSIS — F5105 Insomnia due to other mental disorder: Secondary | ICD-10-CM

## 2024-03-25 ENCOUNTER — Encounter: Payer: Self-pay | Admitting: Diagnostic Neuroimaging

## 2024-03-25 ENCOUNTER — Telehealth: Payer: Self-pay | Admitting: Psychiatry

## 2024-03-25 ENCOUNTER — Ambulatory Visit: Admitting: Diagnostic Neuroimaging

## 2024-03-25 VITALS — BP 120/85 | HR 87 | Ht 62.0 in | Wt 105.4 lb

## 2024-03-25 DIAGNOSIS — H04123 Dry eye syndrome of bilateral lacrimal glands: Secondary | ICD-10-CM | POA: Diagnosis not present

## 2024-03-25 DIAGNOSIS — R202 Paresthesia of skin: Secondary | ICD-10-CM | POA: Diagnosis not present

## 2024-03-25 MED ORDER — VORTIOXETINE HBR 5 MG PO TABS: 5.0000 mg | ORAL_TABLET | Freq: Every day | ORAL | 1 refills | Status: AC

## 2024-03-25 NOTE — Telephone Encounter (Signed)
 Pt called to say that samples of Trintellix  5mg  are working well. Has about 5 left. She would like it sent in to her pharmacy: [facility] [address], [place] - [address]

## 2024-03-25 NOTE — Patient Instructions (Signed)
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

## 2024-03-25 NOTE — Telephone Encounter (Signed)
 Sent Trintellix  5 mg to [facility].

## 2024-03-25 NOTE — Progress Notes (Signed)
 "  [facility]  PATIENT: Alyssa Vaughn DOB: [birth year]  REFERRING CLINICIAN: Verena Mems, MD HISTORY FROM: patient REASON FOR VISIT: follow up   HISTORICAL  CHIEF COMPLAINT:  Chief Complaint  Patient presents with   RM 7     Patient is here alone for tingling follow-up - Patient states she never had issues with numbness. Only has issues with tingling in her legs/ calves, feet, arms, and cheeks     HISTORY OF PRESENT ILLNESS:   UPDATE (03/25/24, VRP): Since last visit, doing well. Symptoms are slightly improved. Now getting regular exercise. Tingling sensations are stable to improving.   UPDATE (05/30/23, VRP): Since last visit, more tingling sensation in face, arms, legs, constant. Non painful sensation. Aching in calves. Left foot pain (toes; has seen podiatry).   PRIOR HPI (08/10/22, VRP): 73 year old female here for evaluation of numbness and tingling.  Symptoms started around 2022.  She describes numbness and tingling sensation in her arms, legs, hands and feet.  Also has history of cervical spine surgery and chronic low back pain.  Was prescribed low-dose gabapentin  but had not tried it yet.   REVIEW OF SYSTEMS: Full 14 system review of systems performed and negative with exception of: as per HPI.  ALLERGIES: Allergies  Allergen Reactions   Biaxin [Clarithromycin]     Upset stomach   Bupropion      Other Reaction(s): dizziness   Cyclobenzaprine     Other Reaction(s): swollen eyes   Desvenlafaxine     Other Reaction(s): Memory loss   Metaxalone Nausea Only   Quetiapine     Other Reaction(s): sleepy   Amoxicillin Rash   Ibuprofen Rash   Nsaids Rash    HOME MEDICATIONS: Outpatient Medications Prior to Visit  Medication Sig Dispense Refill   ALPRAZolam  (XANAX ) 0.5 MG tablet TAKE 1/2 TO 1 (ONE-HALF TO ONE) TABLET BY MOUTH AT BEDTIME AS NEEDED FOR ANXIETY OR SLEEP 30 tablet 1   amLODipine (NORVASC) 2.5 MG tablet Take 2.5 mg by mouth daily.      calcium carbonate (SUPER CALCIUM) 1500 (600 Ca) MG TABS tablet 1 tablet with meals Orally once a day     erythromycin ophthalmic ointment 1 application into the lower eyelid of affected eye Ophthalmic Four times a day for 7 days     Omega-3 1000 MG CAPS Take by mouth.     omeprazole (PRILOSEC) 20 MG capsule 1 capsule 30 minutes before morning meal Orally Once a day as needed for 90 days     VEVYE 0.1 % SOLN Place 1 drop into both eyes 2 (two) times daily.     vitamin C (ASCORBIC ACID) 500 MG tablet Take 500 mg by mouth daily.     vortioxetine  HBr (TRINTELLIX ) 5 MG TABS tablet Take 1 tablet (5 mg total) by mouth daily. 30 tablet 1   DULoxetine  (CYMBALTA ) 20 MG capsule 1 daily for 2 weeks then stop it. (Patient not taking: Reported on 03/25/2024) 15 capsule 0   gabapentin  (NEURONTIN ) 100 MG capsule Take 1 capsule (100 mg total) by mouth 3 (three) times daily. (Patient not taking: Reported on 03/25/2024) 90 capsule 3   MIEBO 1.338 GM/ML SOLN 1 drop into affected eye  as needed Ophthalmic Four times a day (Patient not taking: Reported on 03/25/2024)     No facility-administered medications prior to visit.    PAST MEDICAL HISTORY: Past Medical History:  Diagnosis Date   Arthritis     PAST SURGICAL HISTORY: Past Surgical History:  Procedure Laterality Date   APPENDECTOMY     HERNIA REPAIR      FAMILY HISTORY: Family History  Problem Relation Age of Onset   Migraines Neg Hx    Seizures Neg Hx    Stroke Neg Hx    Sleep apnea Neg Hx     SOCIAL HISTORY: Social History   Socioeconomic History   Marital status: Divorced    Spouse name: Not on file   Number of children: Not on file   Years of education: Not on file   Highest education level: Not on file  Occupational History   Not on file  Tobacco Use   Smoking status: Never   Smokeless tobacco: Never  Vaping Use   Vaping status: Never Used  Substance and Sexual Activity   Alcohol use: Never   Drug use: Never   Sexual  activity: Yes  Other Topics Concern   Not on file  Social History Narrative   1-3 cups of coffee daily, no soda or tea    Social Drivers of Health   Tobacco Use: Low Risk (03/25/2024)   Patient History    Smoking Tobacco Use: Never    Smokeless Tobacco Use: Never    Passive Exposure: Not on file  Financial Resource Strain: Not on file  Food Insecurity: Not on file  Transportation Needs: Not on file  Physical Activity: Not on file  Stress: Not on file  Social Connections: Not on file  Intimate Partner Violence: Not on file  Depression (EYV7-0): Not on file  Alcohol Screen: Not on file  Housing: Not on file  Utilities: Not on file  Health Literacy: Not on file     PHYSICAL EXAM  GENERAL EXAM/CONSTITUTIONAL: Vitals:  Vitals:   03/25/24 1542  BP: 120/85  Pulse: 87  SpO2: 98%  Weight: 105 lb 6.4 oz (47.8 kg)  Height: 5' 2 (1.575 m)   Body mass index is 19.28 kg/m. Wt Readings from Last 3 Encounters:  03/25/24 105 lb 6.4 oz (47.8 kg)  12/27/23 105 lb (47.6 kg)  10/04/23 105 lb (47.6 kg)   Patient is in no distress; well developed, nourished and groomed; neck is supple  CARDIOVASCULAR: Examination of carotid arteries is normal; no carotid bruits Regular rate and rhythm, no murmurs Examination of peripheral vascular system by observation and palpation is normal  EYES: Ophthalmoscopic exam of optic discs and posterior segments is normal; no papilledema or hemorrhages No results found.  MUSCULOSKELETAL: Gait, strength, tone, movements noted in Neurologic exam below  NEUROLOGIC: MENTAL STATUS:      No data to display         awake, alert, oriented to person, place and time recent and remote memory intact normal attention and concentration language fluent, comprehension intact, naming intact fund of knowledge appropriate  CRANIAL NERVE:  2nd - no papilledema on fundoscopic exam 2nd, 3rd, 4th, 6th - pupils equal and reactive to light, visual fields  full to confrontation, extraocular muscles intact, no nystagmus 5th - facial sensation symmetric 7th - facial strength symmetric 8th - hearing intact 9th - palate elevates symmetrically, uvula midline 11th - shoulder shrug symmetric 12th - tongue protrusion midline  MOTOR:  normal bulk and tone, full strength in the BUE, BLE  SENSORY:  normal and symmetric to light touch, temperature, vibration  COORDINATION:  finger-nose-finger, fine finger movements normal  REFLEXES:  deep tendon reflexes 1+ and symmetric  GAIT/STATION:  narrow based gait     DIAGNOSTIC DATA (LABS, IMAGING, TESTING) -  I reviewed patient records, labs, notes, testing and imaging myself where available.  Lab Results  Component Value Date   WBC 7.8 12/27/2023   HGB 13.3 12/27/2023   HCT 38.3 12/27/2023   MCV 93.4 12/27/2023   PLT 223 12/27/2023      Component Value Date/Time   NA 140 12/27/2023 1320   K 3.5 12/27/2023 1320   CL 103 12/27/2023 1320   CO2 24 12/27/2023 1320   GLUCOSE 104 (H) 12/27/2023 1320   BUN 18 12/27/2023 1320   CREATININE 0.71 12/27/2023 1320   CALCIUM 9.5 12/27/2023 1320   PROT 7.0 12/27/2023 1320   PROT 6.2 05/30/2023 1452   ALBUMIN 4.7 12/27/2023 1320   AST 27 12/27/2023 1320   ALT 20 12/27/2023 1320   ALKPHOS 91 12/27/2023 1320   BILITOT 0.6 12/27/2023 1320   GFRNONAA >60 12/27/2023 1320   GFRAA >60 09/05/2017 1535   No results found for: CHOL, HDL, LDLCALC, LDLDIRECT, TRIG, CHOLHDL No results found for: YHAJ8R Lab Results  Component Value Date   VITAMINB12 630 05/30/2023   Lab Results  Component Value Date   TSH 1.240 05/30/2023    06/22/23 MRI brain (with and without) demonstrating: -Small T2 hyperintensity within the far left lateral pons measuring 3 mm, also demonstrates susceptibility on T2 star views.  Considerations include small cavernous malformation versus other calcified lesion. -Elsewhere minimal periventricular subcortical foci of  nonspecific T2 hyperintensities. -No acute findings.  No abnormal enhancing lesions.    ASSESSMENT AND PLAN  73 y.o. year old female here with:  Dx:  1. Dry eyes   2. Tingling in extremities     PLAN:  TINGLING / FUZZY SENSATION (face, arms, legs; mild-moderate; since 2022; some anxiety sxs) - neuro exam normal; MRI brain / cervical spine and neuropathy labs unremarkable - check SSA/SSB ab (also with dry eyes) - continue exercise and supportive care  Incidental MRI finding (considerations include small cavernous malformation versus other calcified lesion) - repeat MRI brain w/wo in March 2026  Orders Placed This Encounter  Procedures   Sjogren's syndrome antibods(ssa + ssb)   Return for return to PCP, pending if symptoms worsen or fail to improve.    EDUARD FABIENE HANLON, MD 03/25/2024, 4:43 PM Certified in Neurology, Neurophysiology and Neuroimaging  [facility] [address] [place], [place] [address] [phone]  "

## 2024-03-26 ENCOUNTER — Telehealth: Payer: Self-pay | Admitting: Psychiatry

## 2024-03-26 LAB — SJOGREN'S SYNDROME ANTIBODS(SSA + SSB)
ENA SSA (RO) Ab: <0.2 AI (ref 0.0–0.9)
ENA SSB (LA) Ab: <0.2 AI (ref 0.0–0.9)

## 2024-03-26 NOTE — Telephone Encounter (Signed)
 Pt lvm reporting Trintellix  5 mg monthly will cost her $177 Not sure who to talk to about it. Return call [phone]

## 2024-03-26 NOTE — Telephone Encounter (Signed)
 Patient lvm at 11:59 regarding previous messages. States that she needs a PA for Trintellix . Please rtc [phone]

## 2024-04-02 NOTE — Telephone Encounter (Signed)
 Nothing in CMM. Called pharmacy and they are closed for lunch. Will try again later.

## 2024-04-02 NOTE — Telephone Encounter (Signed)
 Called to check to see if PA needed. Pt picked up yesterday.

## 2024-04-16 ENCOUNTER — Telehealth: Payer: Self-pay | Admitting: Psychiatry

## 2024-04-16 ENCOUNTER — Ambulatory Visit: Payer: Self-pay | Admitting: Diagnostic Neuroimaging

## 2024-04-16 NOTE — Telephone Encounter (Signed)
 Pt called reporting weaned off duloxetine  and started trintellix  5 mg 1/d, 5 weeks ago. After taking around 3 weeks. Side effects started, confusion, crying, forgetfulness and feeling disconnected. Example: put lip stick on and in a minute go to put lip stick on as first time. Good effects very motivated and wants to go and do things. Will the side effect get better over time? Please contact pt @ [phone]. Next Apt 2/3

## 2024-04-17 NOTE — Telephone Encounter (Signed)
 LVM to RC. Does she have any concerns now since confusion is clearing?

## 2024-04-17 NOTE — Telephone Encounter (Signed)
 Pt returned call.  Asked since she reported confusion was clearing if she had any more concerns and she did not. Feels like the last 2 days things have been much better. Has FU 2/3.

## 2024-04-17 NOTE — Telephone Encounter (Signed)
 Pt called back today reporting severe confusion has cleared today. Started clearing yesterday afternoon.

## 2024-04-24 ENCOUNTER — Telehealth: Payer: Self-pay | Admitting: Psychiatry

## 2024-04-24 NOTE — Telephone Encounter (Signed)
 Stop the Trintellix .  No need to taper.

## 2024-04-24 NOTE — Telephone Encounter (Signed)
 Pt recently weaned off duloxetine  and started on Trintellix . Last week she had reported some confusion but then called back to say that confusion had resolved. She calls again today reporting confusion, feeling disconnected, lots of crying, she doesn't know if she started or stopped something at home. Reports zoning out while driving - stopping for a light but not going when it turns green and people honking. Asked if she got lost while driving and she said she had not, but has gone the wrong or a different way, said she knew she was in [place]. She seems A&O, but distressed d/t sx. She reports taking alprazolam  0.25 mg at bedtime prn in addition to the Trintellix .

## 2024-04-24 NOTE — Telephone Encounter (Signed)
 Pt called reporting side effects had cleared for a few days. Now effects coming back. Feeling disconnected, confused around the house, a lot of crying. While driving zones out, very concerned with that one. This is 8th week on meds. Also, checked on cost and will be $432 monthly. Even if not so costly, not sure she wants to continue. Willing to try what ever provider suggest. Will she need to wean off? [phone] pt contact

## 2024-04-25 NOTE — Telephone Encounter (Signed)
Reviewed recommendations with the patient.

## 2024-05-07 ENCOUNTER — Ambulatory Visit: Admitting: Psychiatry
